# Patient Record
Sex: Female | Born: 1960 | Race: White | Hispanic: No | Marital: Married | Smoking: Current every day smoker
Health system: Southern US, Community
[De-identification: ages and names within clinical notes are randomized; demographics above are authoritative.]

## PROBLEM LIST (undated history)

## (undated) DIAGNOSIS — Z720 Tobacco use: Secondary | ICD-10-CM

## (undated) DIAGNOSIS — F1010 Alcohol abuse, uncomplicated: Secondary | ICD-10-CM

## (undated) DIAGNOSIS — M720 Palmar fascial fibromatosis [Dupuytren]: Secondary | ICD-10-CM

## (undated) DIAGNOSIS — F418 Other specified anxiety disorders: Secondary | ICD-10-CM

## (undated) DIAGNOSIS — I10 Essential (primary) hypertension: Secondary | ICD-10-CM

---

## 2000-10-04 ENCOUNTER — Encounter: Payer: Self-pay | Admitting: Emergency Medicine

## 2000-10-04 ENCOUNTER — Inpatient Hospital Stay (HOSPITAL_COMMUNITY): Admission: AD | Admit: 2000-10-04 | Discharge: 2000-10-06 | Payer: Self-pay | Admitting: *Deleted

## 2000-10-04 ENCOUNTER — Encounter: Payer: Self-pay | Admitting: *Deleted

## 2006-04-07 ENCOUNTER — Inpatient Hospital Stay: Payer: Self-pay | Admitting: Internal Medicine

## 2006-04-07 ENCOUNTER — Other Ambulatory Visit: Payer: Self-pay

## 2006-04-21 ENCOUNTER — Ambulatory Visit: Payer: Self-pay

## 2006-06-15 ENCOUNTER — Ambulatory Visit: Payer: Self-pay

## 2006-06-26 ENCOUNTER — Ambulatory Visit: Payer: Self-pay

## 2007-06-17 ENCOUNTER — Ambulatory Visit: Payer: Self-pay | Admitting: Psychiatry

## 2007-06-17 ENCOUNTER — Inpatient Hospital Stay (HOSPITAL_COMMUNITY): Admission: AD | Admit: 2007-06-17 | Discharge: 2007-06-17 | Payer: Self-pay | Admitting: Psychiatry

## 2007-06-17 ENCOUNTER — Emergency Department (HOSPITAL_COMMUNITY): Admission: EM | Admit: 2007-06-17 | Discharge: 2007-06-17 | Payer: Self-pay | Admitting: Emergency Medicine

## 2007-09-19 ENCOUNTER — Other Ambulatory Visit: Admission: RE | Admit: 2007-09-19 | Discharge: 2007-09-19 | Payer: Self-pay | Admitting: Family Medicine

## 2008-10-13 ENCOUNTER — Emergency Department: Payer: Self-pay | Admitting: Emergency Medicine

## 2009-06-29 ENCOUNTER — Other Ambulatory Visit: Admission: RE | Admit: 2009-06-29 | Discharge: 2009-06-29 | Payer: Self-pay | Admitting: Family Medicine

## 2009-07-01 ENCOUNTER — Encounter: Admission: RE | Admit: 2009-07-01 | Discharge: 2009-07-01 | Payer: Self-pay | Admitting: Family Medicine

## 2009-08-18 ENCOUNTER — Encounter (INDEPENDENT_AMBULATORY_CARE_PROVIDER_SITE_OTHER): Payer: Self-pay | Admitting: Obstetrics and Gynecology

## 2009-08-18 ENCOUNTER — Inpatient Hospital Stay (HOSPITAL_COMMUNITY): Admission: RE | Admit: 2009-08-18 | Discharge: 2009-08-19 | Payer: Self-pay | Admitting: Obstetrics and Gynecology

## 2009-09-22 ENCOUNTER — Observation Stay (HOSPITAL_COMMUNITY): Admission: EM | Admit: 2009-09-22 | Discharge: 2009-09-23 | Payer: Self-pay | Admitting: Emergency Medicine

## 2011-03-17 LAB — POCT I-STAT, CHEM 8
BUN: 6 mg/dL (ref 6–23)
Calcium, Ion: 1.14 mmol/L (ref 1.12–1.32)
Chloride: 106 mEq/L (ref 96–112)

## 2011-03-17 LAB — D-DIMER, QUANTITATIVE: D-Dimer, Quant: 1.01 ug/mL-FEU — ABNORMAL HIGH (ref 0.00–0.48)

## 2011-03-17 LAB — DIFFERENTIAL
Basophils Absolute: 0.1 10*3/uL (ref 0.0–0.1)
Basophils Relative: 1 % (ref 0–1)
Eosinophils Absolute: 0.4 10*3/uL (ref 0.0–0.7)
Eosinophils Relative: 4 % (ref 0–5)
Lymphocytes Relative: 24 % (ref 12–46)
Monocytes Absolute: 0.9 10*3/uL (ref 0.1–1.0)

## 2011-03-17 LAB — CBC
HCT: 38.2 % (ref 36.0–46.0)
Hemoglobin: 13.1 g/dL (ref 12.0–15.0)
Hemoglobin: 13.1 g/dL (ref 12.0–15.0)
MCHC: 34.4 g/dL (ref 30.0–36.0)
MCV: 90.8 fL (ref 78.0–100.0)
Platelets: 376 10*3/uL (ref 150–400)
RDW: 13.2 % (ref 11.5–15.5)
RDW: 13.3 % (ref 11.5–15.5)

## 2011-03-17 LAB — CK TOTAL AND CKMB (NOT AT ARMC): Relative Index: INVALID (ref 0.0–2.5)

## 2011-03-17 LAB — TROPONIN I: Troponin I: 0.03 ng/mL (ref 0.00–0.06)

## 2011-03-17 LAB — CARDIAC PANEL(CRET KIN+CKTOT+MB+TROPI)
CK, MB: 1.2 ng/mL (ref 0.3–4.0)
CK, MB: 1.3 ng/mL (ref 0.3–4.0)
Relative Index: INVALID (ref 0.0–2.5)
Total CK: 46 U/L (ref 7–177)
Troponin I: 0.01 ng/mL (ref 0.00–0.06)

## 2011-03-17 LAB — POCT CARDIAC MARKERS: Troponin i, poc: <0.05 ng/mL (ref 0.00–0.09)

## 2011-03-17 LAB — LIPID PANEL
LDL Cholesterol: 176 mg/dL — ABNORMAL HIGH (ref 0–99)
Triglycerides: 86 mg/dL (ref <150)

## 2011-03-18 LAB — CBC
Hemoglobin: 12.1 g/dL (ref 12.0–15.0)
MCHC: 34.1 g/dL (ref 30.0–36.0)
MCV: 94.3 fL (ref 78.0–100.0)
Platelets: 346 10*3/uL (ref 150–400)
RDW: 13.2 % (ref 11.5–15.5)
RDW: 13.4 % (ref 11.5–15.5)

## 2011-03-18 LAB — BASIC METABOLIC PANEL
GFR calc non Af Amer: >60 mL/min (ref >60)
Potassium: 3.3 mEq/L — ABNORMAL LOW (ref 3.5–5.1)
Sodium: 134 mEq/L — ABNORMAL LOW (ref 135–145)

## 2011-03-18 LAB — TYPE AND SCREEN: Antibody Screen: NEGATIVE

## 2011-03-18 LAB — HCG, SERUM, QUALITATIVE: Preg, Serum: NEGATIVE

## 2011-04-26 NOTE — H&P (Signed)
[name], [name]                ACCOUNT NO.:  [id number]   MEDICAL RECORD NO.:  [id number]          PATIENT TYPE:  IPS   LOCATION:  0506                          FACILITY:  [facility]   PHYSICIAN:  Geoffery Lyons, M.D.      DATE OF BIRTH:  [birth year]   DATE OF ADMISSION:  06/17/2007  DATE OF DISCHARGE:                       PSYCHIATRIC ADMISSION ASSESSMENT   IDENTIFYING INFORMATION:  This is an involuntary admission to the  services of Dr. Geoffery Lyons.  This is a 50 year old married white  female.  Apparently, she called the sheriff yesterday and reported  suicidal ideation.  She was extremely tearful.  She stated she would  just be better off dead.  She stated she just wants to go to sleep and  never wake up.  She drank a lot the night before and called the police  stating that she wanted some mental health.  When the sheriff came to  get her, she was taken to mental health.  She blew over the normal  alcohol limit allowed and so she must be medically cleared and hence was  taken to the ED at [facility].  She was obviously intoxicated.  She stated she just wanted a gun to blow her head off and that she is  not going to stay in this place.  She wants to go home and forget that  all this happened.  Recent stressors include children back in the home  that are of legal age and her father who is dying in the hospital and is  actually under hospice care.  Alcohol level in the ED was 209.  Her UDS  was negative for any other substances.  Her urinalysis was positive for  leukocyte esterase so she does have a UTI as well.   PAST PSYCHIATRIC HISTORY:  The patient states that her drinking has  become problematic in the last two years.  She did go the [facility]  in September of 2007 and tried to stop drinking on her own but however  has been unsuccessful.   SOCIAL HISTORY:  She graduated high school in 1982.  She has been  married once for 25 years.  She has two children, a son 38 and  a  daughter who is 20 today.  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   FAMILY HISTORY:  She states that her whole family on both sides have  substance abuse issues.   ALCOHOL/DRUG HISTORY:  She has been drinking alcohol since age 50, 8-12  beers six times per week for the past two years.   PRIMARY CARE PHYSICIAN:  She does not have a medical doctor.   MEDICAL PROBLEMS:  She has no known medical problems although today she  does have a urinary tract infection.   ALLERGIES:  No known drug allergies.   POSITIVE PHYSICAL FINDINGS:  She was medically cleared in the ED.  She  was noted to have an elevated white blood cell count at 13.3.  Leukocyte  esterase was positive and her alcohol level was 209.  Her  vital signs on  admission to our unit show that she is 5 feet 3 inches, weighs 136.5  pounds, temperature is 97.1, blood pressure 138/94, pulse 93-101,  respirations 20.  She is status post C-sections in 1986 and 1985.  She  does have hand and joint pain and her job is very physical as the  produce at the [employer].   MENTAL STATUS EXAM:  She is alert and oriented.  She is casually groomed  and dressed in scrubs.  She appears to be adequately nourished.  Her  speech is not pressured.  Her mood is depressed and anxious as is her  affect.  However, it is appropriate to the situation.  Thought processes  are clear, rational and goal-oriented.  She is requesting discharge as  she is fearful that she will lose her employment.  Judgment and insight  seemed to be intact.  Concentration and memory are intact.  Intelligence  is at least average.  She denies being suicidal or homicidal.  She  denies any auditory or visual hallucinations.  She denies tremor.   DIAGNOSES:  AXIS I:  Alcohol abuse; rule out dependence.  Major  depressive disorder.  AXIS II:  Deferred.  AXIS III:  Urinary tract infection, pain in joints.  AXIS IV:  Conflict with husband and  children.  AXIS V:  30.   PLAN:  To admit for detox.  To start medications, specifically Cymbalta.  We will treat her UTI with Cipro.  She was encouraged to approach  hospice regarding grief support and a family session with her husband  was set up this afternoon as if all parties are in agreement, we may be  able to discharge her and ease her mind about potential loss of her  employment.      Mickie Leonarda Salon, P.A.-C.      Geoffery Lyons, M.D.  Electronically Signed    MD/MEDQ  D:  06/17/2007  T:  06/17/2007  Job:  [id number]

## 2011-04-29 NOTE — Discharge Summary (Signed)
[facility]  Patient:    [name], [name]                       MRN: [id number] Adm. Date:  [id number] Disc. Date: [id number] Attending:  Ardeen Fillers                           Discharge Summary  DISCHARGE DIAGNOSES:          1. Viral gastroenteritis.                               2. Dehydration.                               3. Hypokalemia.                               4. Left ovarian cyst.                               5. Status post tubal ligation.  HISTORY OF PRESENT ILLNESS:   A 50 year old woman, G2, P2, status post tubal ligation in the 1980s presented to the [facility] Emergency Room early on the morning of October 04, 2000, with complaints of two days of lower abdominal pain and relentless nausea and vomiting.  A GYN consultation was called and I evaluated her for CT scan with findings of left ovarian cyst and rule out GYN etiology of pain.  The pain was initially in left lower quadrant but spread over the lower abdomen over the following two days.  She vomited and had diarrhea continually.  CT scan of the abdomen revealed a negative upper abdomen and a left ovarian cyst.  White blood cell count on admission was 19,000.  Her abdomen was distended and there was guarding but there were no peritoneal signs.  Her potassium was 3.2.  Urinalysis was negative.  Although symptoms were most consistent with a viral gastroenteritis, because of the CT findings, she was taken on the [facility] OB[facility]GYN service and transferred to [facility] for observation.  HOSPITAL COURSE:              Over the next two days she defervesced spontaneously.  Her hypokalemia was repleted with IV potassium.  On the second hospital day, the pain had localized in the right lower quadrant.  Surgical consultation was obtained and she was seen by Adolph Pollack, M.D., who did not feel that she had appendicitis.  She had no further nausea or vomiting or diarrhea  after admission to [facility].  The patients pain remitted and was most consistent with gas discomfort at the time of discharge on the third hospital day.  She will be followed up by [facility] Amabile. Roslyn Smiling, M.D., as a new GYN patient in the next few weeks.  Consideration of repeat ultrasound will be performed, although the left cyst is most consistent with a physiologic ovarian cyst.  DISPOSITION:                  She was discharged to home in stable condition on October 06, 2000.  FOLLOW-UP:  She will follow up with her primary doctor if her GI symptoms recur.  MEDICATIONS AT DISCHARGE:     Tylenol p.r.n.  Glycerin p.r.n.  Multivitamins for mild anemia.  Hemoglobin on the second hospital day was 11.1. DD:  10/06/00 TD:  10/06/00 Job: [id number]

## 2011-04-29 NOTE — Discharge Summary (Signed)
[name], [name]                ACCOUNT NO.:  [id number]   MEDICAL RECORD NO.:  [id number]          PATIENT TYPE:  IPS   LOCATION:  0506                          FACILITY:  [facility]   PHYSICIAN:  Geoffery Lyons, M.D.      DATE OF BIRTH:  January 18, 1961   DATE OF ADMISSION:  06/17/2007  DATE OF DISCHARGE:  06/17/2007                               DISCHARGE SUMMARY   CHIEF COMPLAINT AND HISTORY OF PRESENT ILLNESS:  This was the first  admission to [facility] for this 50 year old married,  white female.  She called the [place] _________[place]  police with suicidal  thoughts, being very tearful.  Endorsed then that she would rather be  dead.  Endorsed that she just wanted to go to sleep and never wake up.  She drank a lot that night.  She called the police stating that she  wanted some help.  She was taken to mental health.   Upon this evaluation, she endorsed that she was intoxicated.  She felt  she had to go home.  She had children at home that were of little age  and her father who died in the hospital was under Hospice care.   PAST PSYCHIATRIC HISTORY:  Had been to [facility] in September 2007,  tried to stop drinking, was not successful.   ALCOHOL AND DRUG HISTORY:  Endorsed that alcohol had become a problem in  the last 2 years.  Denied any other substances.   MEDICAL HISTORY:  Urinary tract infection.   PHYSICAL EXAMINATION:  Performed and failed to show any acute findings.   LABORATORY DATA:  laboratory workup not available in the chart.   MENTAL STATUS EXAM:  Reveals an alert, cooperative female casually  groomed and dressed.  Speech was not pressured, normal rate, tempo and  production.  Mood depressed, anxious.  Thought processes are clear,  rational and goal-oriented.  Requesting discharge.  Afraid she was going  to lose her employment.  Concerned about her kids and her father.  There  are no active suicidal or homicidal ideas, no hallucinations.  Cognition  was  well-preserved.   ADMISSION DIAGNOSIS:  AXIS I: Alcohol dependence, depressive disorder  not otherwise specified.  AXIS II: No diagnosis.  AXIS III: Urinary tract infection.  AXIS IV: Moderate.  AXIS V:  GAF on admission 30, GAF in the last year 60.   COURSE IN THE HOSPITAL:  She was admitted.  We attempted to detox, but  she was wanting to go home.  We contacted the family.  The husband came.  They talked about what was going on.  She was willing to go to [facility].  The husband was supportive.  She endorsed she was not suicidal,  so we went ahead and discharged her to outpatient followup.   DISCHARGE DIAGNOSIS:  AXIS I: Alcohol dependence, depressive disorder  not otherwise specified.  AXIS II: No diagnosis.  AXIS III: Urinary tract infection.  AXIS IV: Moderate.  AXIS V:  GAF on discharge 50.   DISCHARGE MEDICATIONS:  1. Librium taper 25 mg one  at bedtime on July 6; then July 7 Librium      25 take one 3 times a day; July 8  Librium 25 one twice a day; July      9 Librium 25 one in the morning, then discontinue.  2. Cymbalta 60 mg per day.   FOLLOW UP:  Follow up at [facility].      Geoffery Lyons, M.D.  Electronically Signed     IL/MEDQ  D:  07/16/2007  T:  07/17/2007  Job:  [id number]

## 2011-05-18 IMAGING — CT CT ANGIO CHEST
2 of 6 series · 19 of 36 positions shown · IV contrast (agent unspecified)
Comparison: none

CLINICAL DATA: Severe chest pain on the left side.

CT ANGIOGRAPHY CHEST WITH CONTRAST
TECHNIQUE: Multidetector CT imaging of the chest was performed
using the standard protocol during bolus administration of
intravenous contrast. Multiplanar CT image reconstructions
including MIPs were obtained to evaluate the vascular anatomy.
Contrast: 100 mlOmnipaque 300

[Series 9: pulm embolism 1.0 b25f thins · axial · 0.65mm/px · z∈[-230,-18]mm · 18 of 236 slices shown]
[im 12/236  lung]
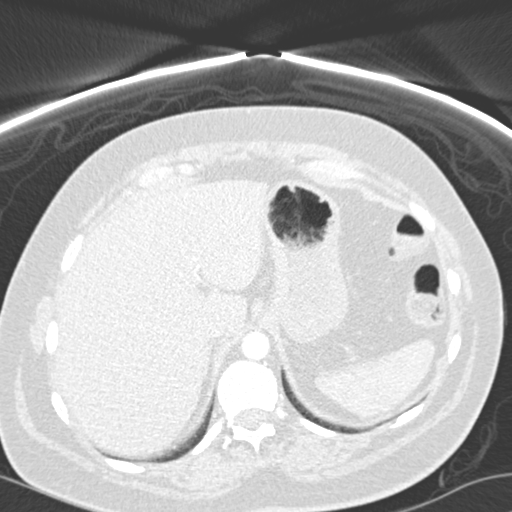
[im 24/236  mediastinal]
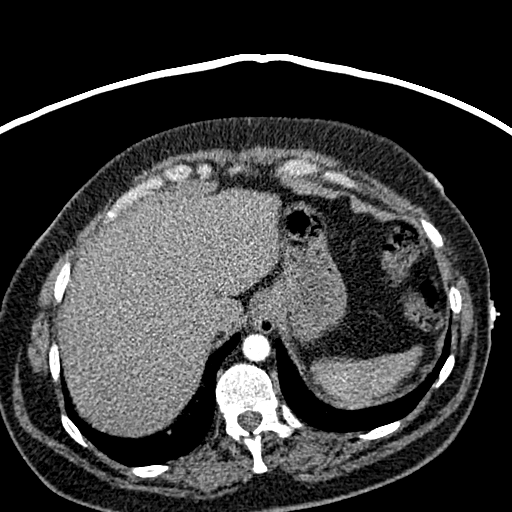
[im 36/236  lung]
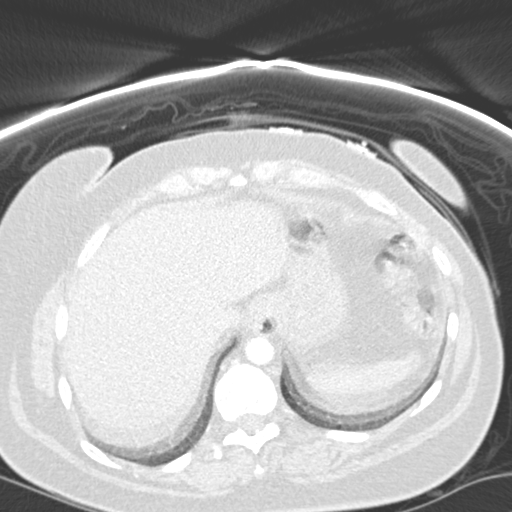
[im 48/236  mediastinal]
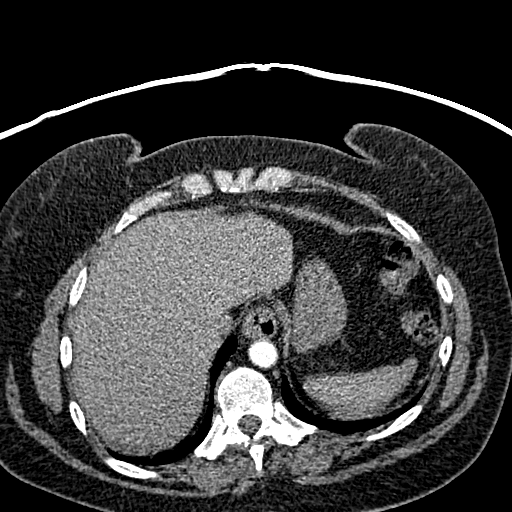
[im 59/236  lung]
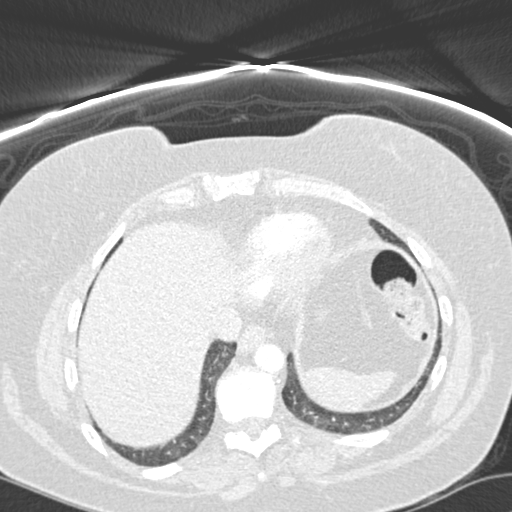
[im 71/236  mediastinal]
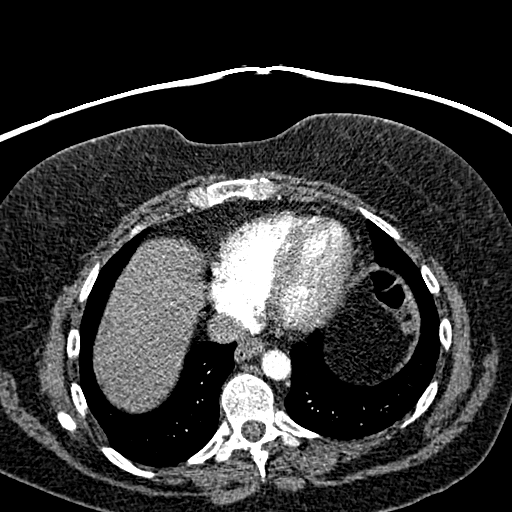
[im 83/236  lung]
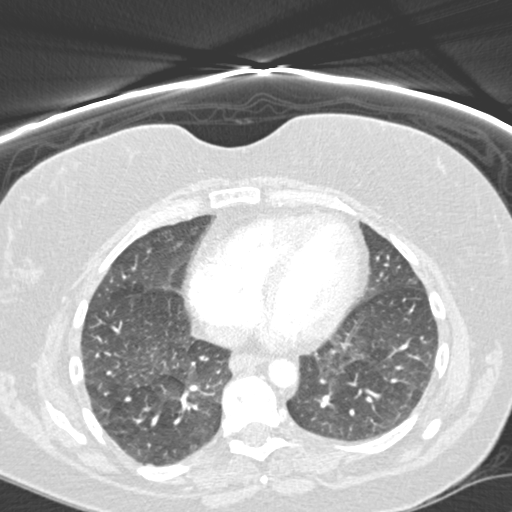
[im 95/236  mediastinal]
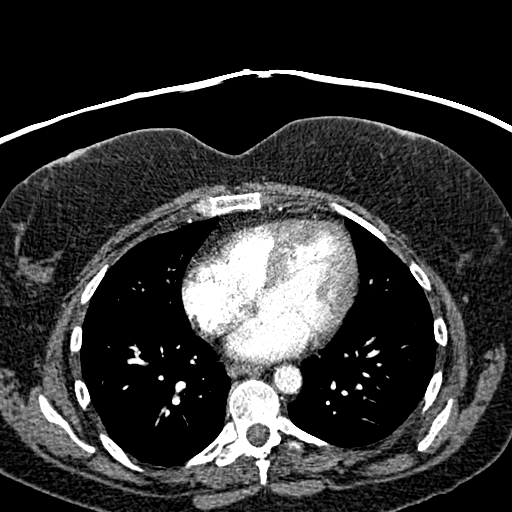
[im 106/236  lung]
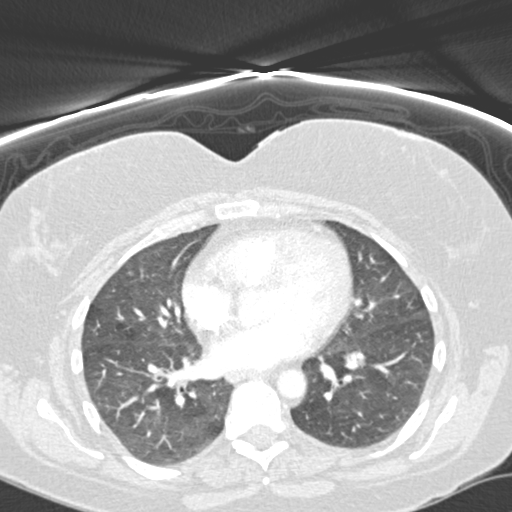
[im 130/236  mediastinal]
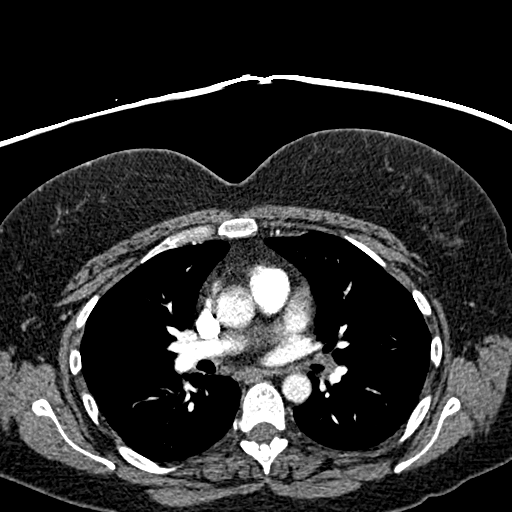
[im 142/236  lung]
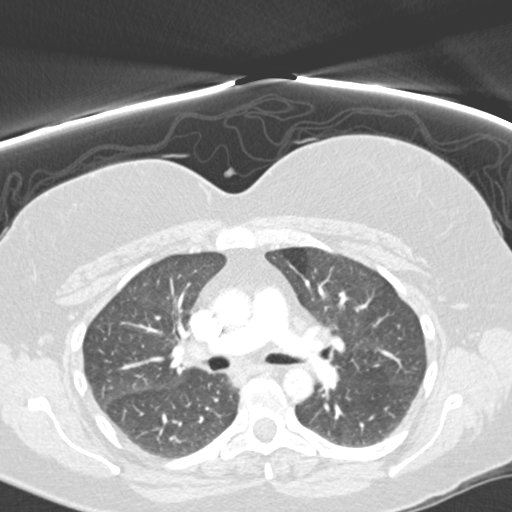
[im 153/236  mediastinal]
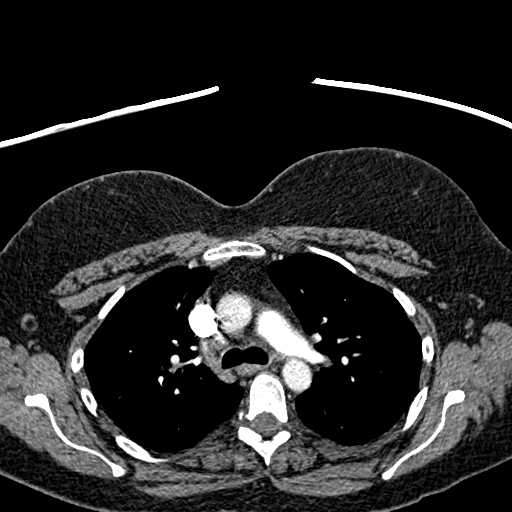
[im 165/236  lung]
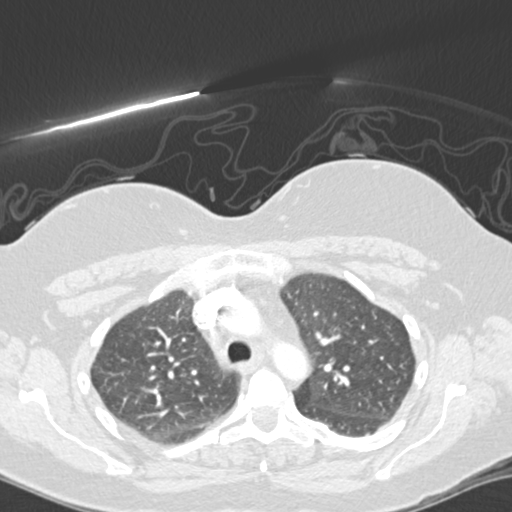
[im 177/236  mediastinal]
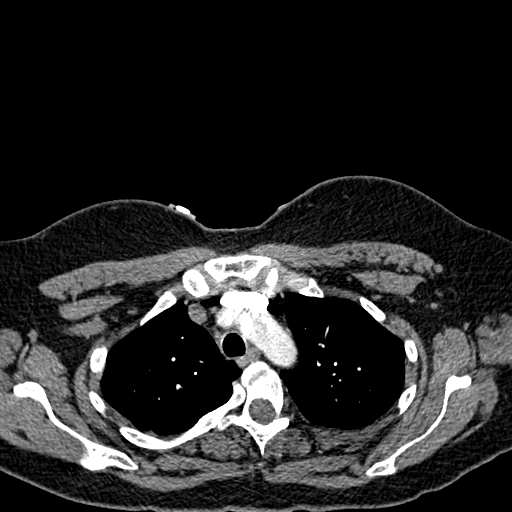
[im 189/236  lung]
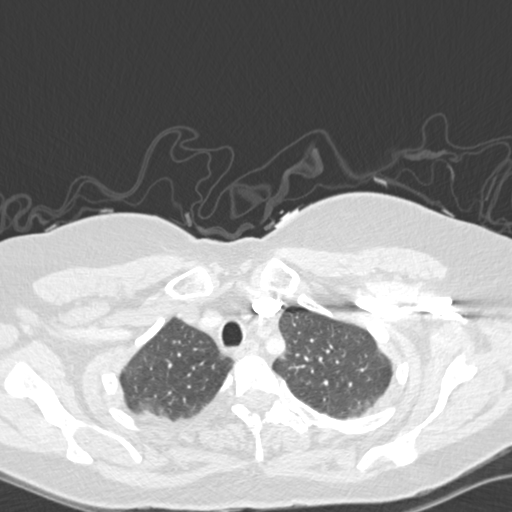
[im 200/236  mediastinal]
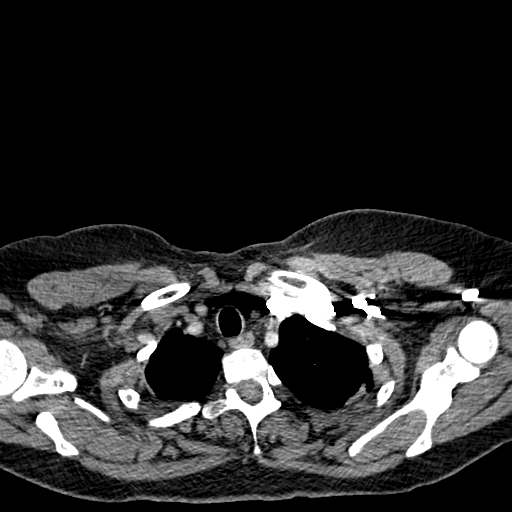
[im 212/236  lung]
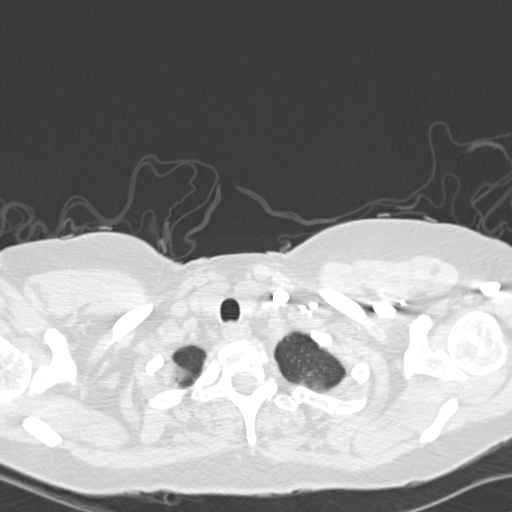
[im 224/236  mediastinal]
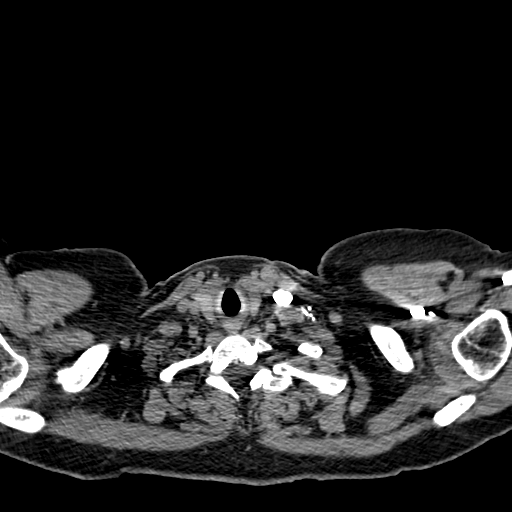

[Series 602: cor mpr · coronal · 0.65mm/px · 1 of 85 slices shown]
[im 43/85  mediastinal]
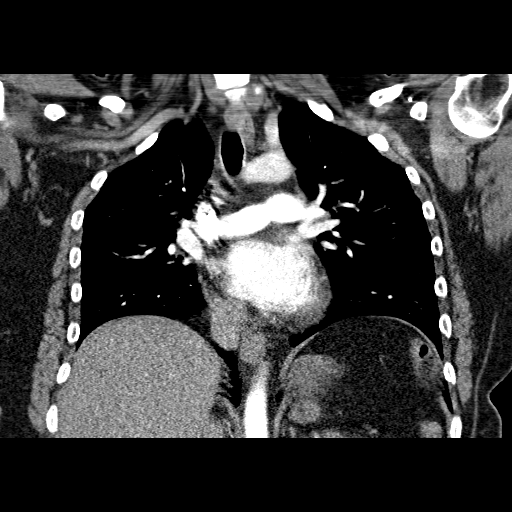

[19 of 36 positions shown; findings below may reference images not displayed]

FINDINGS: Study is technically adequate.  There are no filling
defects within the pulmonary arteries to suggest acute pulmonary
embolism.  No acute findings of the aorta or great vessels.  No
pericardial effusion.

No chest wall abnormality.  No evidence of axillary or
supraclavicular lymphadenopathy.  No mediastinal or hilar
lymphadenopathy.

Lung windows demonstrates no evidence of effusion, pneumonia, or
pneumothorax.

Limited view of the upper abdomen is unremarkable.

Limited view of the skeleton is unremarkable.

Review of the MIP images confirms the above findings.
IMPRESSION: No evidence acute pulmonary embolism.

## 2011-09-27 LAB — URINALYSIS, ROUTINE W REFLEX MICROSCOPIC
Ketones, ur: NEGATIVE
Protein, ur: NEGATIVE
Urobilinogen, UA: 0.2

## 2011-09-27 LAB — URINE MICROSCOPIC-ADD ON

## 2011-09-27 LAB — CBC
HCT: 44.7
Hemoglobin: 15.3 — ABNORMAL HIGH
MCHC: 34.3
MCV: 91.6
RBC: 4.88
RDW: 13.4

## 2011-09-27 LAB — I-STAT 8, (EC8 V) (CONVERTED LAB)
Glucose, Bld: 102 — ABNORMAL HIGH
Potassium: 3.7
TCO2: 23
pH, Ven: 7.357 — ABNORMAL HIGH

## 2011-09-27 LAB — RAPID URINE DRUG SCREEN, HOSP PERFORMED
Barbiturates: NOT DETECTED
Cocaine: NOT DETECTED
Opiates: NOT DETECTED
Tetrahydrocannabinol: NOT DETECTED

## 2011-09-27 LAB — POCT I-STAT CREATININE: Operator id: 133351

## 2011-09-27 LAB — DIFFERENTIAL
Basophils Absolute: 0.2 — ABNORMAL HIGH
Basophils Relative: 2 — ABNORMAL HIGH
Eosinophils Absolute: 0.5
Eosinophils Relative: 3
Monocytes Absolute: 0.9 — ABNORMAL HIGH
Monocytes Relative: 7

## 2020-02-13 ENCOUNTER — Other Ambulatory Visit: Payer: Self-pay

## 2020-02-14 ENCOUNTER — Ambulatory Visit (INDEPENDENT_AMBULATORY_CARE_PROVIDER_SITE_OTHER): Payer: Self-pay | Admitting: Family Medicine

## 2020-02-14 ENCOUNTER — Encounter: Payer: Self-pay | Admitting: Family Medicine

## 2020-02-14 VITALS — BP 168/108 | HR 97 | Temp 97.7°F | Ht 63.0 in | Wt 139.2 lb

## 2020-02-14 DIAGNOSIS — Z7289 Other problems related to lifestyle: Secondary | ICD-10-CM

## 2020-02-14 DIAGNOSIS — L639 Alopecia areata, unspecified: Secondary | ICD-10-CM | POA: Insufficient documentation

## 2020-02-14 DIAGNOSIS — R030 Elevated blood-pressure reading, without diagnosis of hypertension: Secondary | ICD-10-CM | POA: Insufficient documentation

## 2020-02-14 DIAGNOSIS — Z789 Other specified health status: Secondary | ICD-10-CM

## 2020-02-14 DIAGNOSIS — L659 Nonscarring hair loss, unspecified: Secondary | ICD-10-CM | POA: Insufficient documentation

## 2020-02-14 DIAGNOSIS — Z23 Encounter for immunization: Secondary | ICD-10-CM

## 2020-02-14 DIAGNOSIS — F418 Other specified anxiety disorders: Secondary | ICD-10-CM

## 2020-02-14 DIAGNOSIS — F1090 Alcohol use, unspecified, uncomplicated: Secondary | ICD-10-CM

## 2020-02-14 DIAGNOSIS — Z0000 Encounter for general adult medical examination without abnormal findings: Secondary | ICD-10-CM

## 2020-02-14 LAB — URINALYSIS, ROUTINE W REFLEX MICROSCOPIC
Bilirubin Urine: NEGATIVE
Hgb urine dipstick: NEGATIVE
Ketones, ur: NEGATIVE
Leukocytes,Ua: NEGATIVE
Nitrite: NEGATIVE
Specific Gravity, Urine: >=1.03 — AB (ref 1.000–1.030)
Total Protein, Urine: NEGATIVE
Urine Glucose: NEGATIVE
Urobilinogen, UA: 0.2 (ref 0.0–1.0)
pH: 5 (ref 5.0–8.0)

## 2020-02-14 LAB — COMPREHENSIVE METABOLIC PANEL
ALT: 28 U/L (ref 0–35)
AST: 26 U/L (ref 0–37)
Albumin: 4.4 g/dL (ref 3.5–5.2)
Alkaline Phosphatase: 113 U/L (ref 39–117)
BUN: 8 mg/dL (ref 6–23)
CO2: 31 mEq/L (ref 19–32)
Calcium: 10.4 mg/dL (ref 8.4–10.5)
Chloride: 103 mEq/L (ref 96–112)
Creatinine, Ser: 0.69 mg/dL (ref 0.40–1.20)
GFR: 87.87 mL/min (ref >60.00)
Glucose, Bld: 89 mg/dL (ref 70–99)
Potassium: 4.7 mEq/L (ref 3.5–5.1)
Sodium: 142 mEq/L (ref 135–145)
Total Bilirubin: 0.4 mg/dL (ref 0.2–1.2)
Total Protein: 7.8 g/dL (ref 6.0–8.3)

## 2020-02-14 LAB — CBC
HCT: 46 % (ref 36.0–46.0)
Hemoglobin: 15.8 g/dL — ABNORMAL HIGH (ref 12.0–15.0)
MCHC: 34.4 g/dL (ref 30.0–36.0)
MCV: 96.7 fl (ref 78.0–100.0)
Platelets: 301 10*3/uL (ref 150.0–400.0)
RBC: 4.76 Mil/uL (ref 3.87–5.11)
RDW: 12.9 % (ref 11.5–15.5)
WBC: 7.8 10*3/uL (ref 4.0–10.5)

## 2020-02-14 LAB — LIPID PANEL
Cholesterol: 228 mg/dL — ABNORMAL HIGH (ref 0–200)
HDL: 61 mg/dL (ref >39.00)
LDL Cholesterol: 142 mg/dL — ABNORMAL HIGH (ref 0–99)
NonHDL: 166.56
Total CHOL/HDL Ratio: 4
Triglycerides: 123 mg/dL (ref 0.0–149.0)
VLDL: 24.6 mg/dL (ref 0.0–40.0)

## 2020-02-14 LAB — GAMMA GT: GGT: 58 U/L — ABNORMAL HIGH (ref 7–51)

## 2020-02-14 LAB — TSH: TSH: 2.43 u[IU]/mL (ref 0.35–4.50)

## 2020-02-14 MED ORDER — PAROXETINE HCL 10 MG PO TABS: ORAL_TABLET | ORAL | 1 refills | Status: AC

## 2020-02-14 NOTE — Patient Instructions (Signed)
Alopecia Areata, Adult  Alopecia areata is a condition that causes you to lose hair. You may lose hair on your scalp in patches. In some cases, you may lose all the hair on your scalp (alopecia totalis) or all the hair from your face and body (alopecia universalis). Alopecia areata is an autoimmune disease. This means that your body's defense system (immune system) mistakes normal parts of the body for germs or other things that can make you sick. When you have alopecia areata, the immune system attacks the hair follicles. Alopecia areata usually develops in childhood, but it can develop at any age. For some people, their hair grows back on its own and hair loss does not happen again. For others, their hair may fall out and grow back in cycles. The hair loss may last many years. Having this condition can be emotionally difficult, but it is not dangerous. What are the causes? The cause of this condition is not known. What increases the risk? This condition is more likely to develop in people who have:  A family history of alopecia.  A family history of another autoimmune disease, including type 1 diabetes and rheumatoid arthritis.  Asthma and allergies.  Down syndrome. What are the signs or symptoms? Round spots of patchy hair loss on the scalp is the main symptom of this condition. The spots may be mildly itchy. Other symptoms include:  Short dark hairs in the bald patches that are wider at the top (exclamation point hairs).  Dents, white spots, or lines in the fingernails or toenails.  Balding and body hair loss. This is rare. How is this diagnosed? This condition is diagnosed based on your symptoms and family history. Your health care provider will also check your scalp skin, teeth, and nails. Your health care provider may refer you to a specialist in hair and skin disorders (dermatologist). You may also have tests, including:  A hair pull test.  Blood tests or other screening tests  to check for autoimmune diseases, such as thyroid disease or diabetes.  Skin biopsy to confirm the diagnosis.  A procedure to examine the skin with a lighted magnifying instrument (dermoscopy). How is this treated? There is no cure for alopecia areata. Treatment is aimed at promoting the regrowth of hair and preventing the immune system from overreacting. No single treatment is right for all people with alopecia areata. It depends on the type of hair loss you have and how severe it is. Work with your health care provider to find the best treatment for you. Treatment may include:  Having regular checkups to make sure the condition is not getting worse (watchful waiting).  Steroid creams or pills for 6-8 weeks to stop the immune reaction and help hair to regrow more quickly.  Other topical medicines to alter the immune system response and support the hair growth cycle.  Steroid injections.  Therapy and counseling with a support group or therapist if you are having trouble coping with hair loss. Follow these instructions at home:  Learn as much as you can about your condition.  Apply topical creams only as told by your health care provider.  Take over-the-counter and prescription medicines only as told by your health care provider.  Consider getting a wig or products to make hair look fuller or to cover bald spots, if you feel uncomfortable with your appearance.  Get therapy or counseling if you are having a hard time coping with hair loss. Ask your health care provider to recommend  a counselor or support group.  Keep all follow-up visits as told by your health care provider. This is important. Contact a health care provider if:  Your hair loss gets worse, even with treatment.  You have new symptoms.  You are struggling emotionally. Summary  Alopecia areata is an autoimmune condition that makes your body's defense system (immune system) attack the hair follicles. This causes you  to lose hair.  Treatments may include regular checkups to make sure that the condition is not getting worse (watchful waiting), medicines, and steroid injections. This information is not intended to replace advice given to you by your health care provider. Make sure you discuss any questions you have with your health care provider. Document Revised: 11/10/2017 Document Reviewed: 12/16/2016 Elsevier Patient Education  2020 [place] Maintenance, Female Adopting a healthy lifestyle and getting preventive care are important in promoting health and wellness. Ask your health care provider about:  The right schedule for you to have regular tests and exams.  Things you can do on your own to prevent diseases and keep yourself healthy. What should I know about diet, weight, and exercise? Eat a healthy diet   Eat a diet that includes plenty of vegetables, fruits, low-fat dairy products, and lean protein.  Do not eat a lot of foods that are high in solid fats, added sugars, or sodium. Maintain a healthy weight Body mass index (BMI) is used to identify weight problems. It estimates body fat based on height and weight. Your health care provider can help determine your BMI and help you achieve or maintain a healthy weight. Get regular exercise Get regular exercise. This is one of the most important things you can do for your health. Most adults should:  Exercise for at least 150 minutes each week. The exercise should increase your heart rate and make you sweat (moderate-intensity exercise).  Do strengthening exercises at least twice a week. This is in addition to the moderate-intensity exercise.  Spend less time sitting. Even light physical activity can be beneficial. Watch cholesterol and blood lipids Have your blood tested for lipids and cholesterol at 57 years of age, then have this test every 5 years. Have your cholesterol levels checked more often if:  Your lipid or cholesterol  levels are high.  You are older than 57 years of age.  You are at high risk for heart disease. What should I know about cancer screening? Depending on your health history and family history, you may need to have cancer screening at various ages. This may include screening for:  Breast cancer.  Cervical cancer.  Colorectal cancer.  Skin cancer.  Lung cancer. What should I know about heart disease, diabetes, and high blood pressure? Blood pressure and heart disease  High blood pressure causes heart disease and increases the risk of stroke. This is more likely to develop in people who have high blood pressure readings, are of African descent, or are overweight.  Have your blood pressure checked: ? Every 3-5 years if you are 81-24 years of age. ? Every year if you are 59 years old or older. Diabetes Have regular diabetes screenings. This checks your fasting blood sugar level. Have the screening done:  Once every three years after age 84 if you are at a normal weight and have a low risk for diabetes.  More often and at a younger age if you are overweight or have a high risk for diabetes. What should I know about preventing infection? Hepatitis  B If you have a higher risk for hepatitis B, you should be screened for this virus. Talk with your health care provider to find out if you are at risk for hepatitis B infection. Hepatitis C Testing is recommended for:  Everyone born from [birth year] through 1963.  Anyone with known risk factors for hepatitis C. Sexually transmitted infections (STIs)  Get screened for STIs, including gonorrhea and chlamydia, if: ? You are sexually active and are younger than 57 years of age. ? You are older than 57 years of age and your health care provider tells you that you are at risk for this type of infection. ? Your sexual activity has changed since you were last screened, and you are at increased risk for chlamydia or gonorrhea. Ask your health care  provider if you are at risk.  Ask your health care provider about whether you are at high risk for HIV. Your health care provider may recommend a prescription medicine to help prevent HIV infection. If you choose to take medicine to prevent HIV, you should first get tested for HIV. You should then be tested every 3 months for as long as you are taking the medicine. Pregnancy  If you are about to stop having your period (premenopausal) and you may become pregnant, seek counseling before you get pregnant.  Take 400 to 800 micrograms (mcg) of folic acid every day if you become pregnant.  Ask for birth control (contraception) if you want to prevent pregnancy. Osteoporosis and menopause Osteoporosis is a disease in which the bones lose minerals and strength with aging. This can result in bone fractures. If you are 71 years old or older, or if you are at risk for osteoporosis and fractures, ask your health care provider if you should:  Be screened for bone loss.  Take a calcium or vitamin D supplement to lower your risk of fractures.  Be given hormone replacement therapy (HRT) to treat symptoms of menopause. Follow these instructions at home: Lifestyle  Do not use any products that contain nicotine or tobacco, such as cigarettes, e-cigarettes, and chewing tobacco. If you need help quitting, ask your health care provider.  Do not use street drugs.  Do not share needles.  Ask your health care provider for help if you need support or information about quitting drugs. Alcohol use  Do not drink alcohol if: ? Your health care provider tells you not to drink. ? You are pregnant, may be pregnant, or are planning to become pregnant.  If you drink alcohol: ? Limit how much you use to 0-1 drink a day. ? Limit intake if you are breastfeeding.  Be aware of how much alcohol is in your drink. In the [place] one drink equals one 12 oz bottle of beer (355 mL), one 5 oz glass of wine (148 mL), or one 1  oz glass of hard liquor (44 mL). General instructions  Schedule regular health, dental, and eye exams.  Stay current with your vaccines.  Tell your health care provider if: ? You often feel depressed. ? You have ever been abused or do not feel safe at home. Summary  Adopting a healthy lifestyle and getting preventive care are important in promoting health and wellness.  Follow your health care provider's instructions about healthy diet, exercising, and getting tested or screened for diseases.  Follow your health care provider's instructions on monitoring your cholesterol and blood pressure. This information is not intended to replace advice given to you by your health  care provider. Make sure you discuss any questions you have with your health care provider. Document Revised: 11/21/2018 Document Reviewed: 11/21/2018 Elsevier Patient Education  [place].  Preventing Hypertension Hypertension, commonly called high blood pressure, is when the force of blood pumping through the arteries is too strong. Arteries are blood vessels that carry blood from the heart throughout the body. Over time, hypertension can damage the arteries and decrease blood flow to important parts of the body, including the brain, heart, and kidneys. Often, hypertension does not cause symptoms until blood pressure is very high. For this reason, it is important to have your blood pressure checked on a regular basis. Hypertension can often be prevented with diet and lifestyle changes. If you already have hypertension, you can control it with diet and lifestyle changes, as well as medicine. What nutrition changes can be made? Maintain a healthy diet. This includes:  Eating less salt (sodium). Ask your health care provider how much sodium is safe for you to have. The general recommendation is to consume less than 1 tsp (2,300 mg) of sodium a day. ? Do not add salt to your food. ? Choose low-sodium options when  grocery shopping and eating out.  Limiting fats in your diet. You can do this by eating low-fat or fat-free dairy products and by eating less red meat.  Eating more fruits, vegetables, and whole grains. Make a goal to eat: ? 1-2 cups of fresh fruits and vegetables each day. ? 3-4 servings of whole grains each day.  Avoiding foods and beverages that have added sugars.  Eating fish that contain healthy fats (omega-3 fatty acids), such as mackerel or salmon. If you need help putting together a healthy eating plan, try the DASH diet. This diet is high in fruits, vegetables, and whole grains. It is low in sodium, red meat, and added sugars. DASH stands for Dietary Approaches to Stop Hypertension. What lifestyle changes can be made?   Lose weight if you are overweight. Losing just 3?5% of your body weight can help prevent or control hypertension. ? For example, if your present weight is 200 lb (91 kg), a loss of 3-5% of your weight means losing 6-10 lb (2.7-4.5 kg). ? Ask your health care provider to help you with a diet and exercise plan to safely lose weight.  Get enough exercise. Do at least 150 minutes of moderate-intensity exercise each week. ? You could do this in short exercise sessions several times a day, or you could do longer exercise sessions a few times a week. For example, you could take a brisk 10-minute walk or bike ride, 3 times a day, for 5 days a week.  Find ways to reduce stress, such as exercising, meditating, listening to music, or taking a yoga class. If you need help reducing stress, ask your health care provider.  Do not smoke. This includes e-cigarettes. Chemicals in tobacco and nicotine products raise your blood pressure each time you smoke. If you need help quitting, ask your health care provider.  Avoid alcohol. If you drink alcohol, limit alcohol intake to no more than 1 drink a day for nonpregnant women and 2 drinks a day for men. One drink equals 12 oz of beer, 5  oz of wine, or 1 oz of hard liquor. Why are these changes important? Diet and lifestyle changes can help you prevent hypertension, and they may make you feel better overall and improve your quality of life. If you have hypertension, making these changes  will help you control it and help prevent major complications, such as:  Hardening and narrowing of arteries that supply blood to: ? Your heart. This can cause a heart attack. ? Your brain. This can cause a stroke. ? Your kidneys. This can cause kidney failure.  Stress on your heart muscle, which can cause heart failure. What can I do to lower my risk?  Work with your health care provider to make a hypertension prevention plan that works for you. Follow your plan and keep all follow-up visits as told by your health care provider.  Learn how to check your blood pressure at home. Make sure that you know your personal target blood pressure, as told by your health care provider. How is this treated? In addition to diet and lifestyle changes, your health care provider may recommend medicines to help lower your blood pressure. You may need to try a few different medicines to find what works best for you. You also may need to take more than one medicine. Take over-the-counter and prescription medicines only as told by your health care provider. Where to find support Your health care provider can help you prevent hypertension and help you keep your blood pressure at a healthy level. Your local hospital or your community may also provide support services and prevention programs. The American Heart Association offers an online support network at: CheapBootlegs.com.cy Where to find more information Learn more about hypertension from:  [place], Lung, and Blood Institute: ElectronicHangman.is  Centers for Disease Control and Prevention: https://ingram.com/  American Academy of  Family Physicians: http://familydoctor.org/familydoctor/en/diseases-conditions/high-blood-pressure.printerview.all.html Learn more about the DASH diet from:  [place], Lung, and [place]: https://www.reyes.com/ Contact a health care provider if:  You think you are having a reaction to medicines you have taken.  You have recurrent headaches or feel dizzy.  You have swelling in your ankles.  You have trouble with your vision. Summary  Hypertension often does not cause any symptoms until blood pressure is very high. It is important to get your blood pressure checked regularly.  Diet and lifestyle changes are the most important steps in preventing hypertension.  By keeping your blood pressure in a healthy range, you can prevent complications like heart attack, heart failure, stroke, and kidney failure.  Work with your health care provider to make a hypertension prevention plan that works for you. This information is not intended to replace advice given to you by your health care provider. Make sure you discuss any questions you have with your health care provider. Document Revised: 03/22/2019 Document Reviewed: 08/08/2016 Elsevier Patient Education  2020 Elsevier Inc.  Preventive Care 63-42 Years Old, Female Preventive care refers to visits with your health care provider and lifestyle choices that can promote health and wellness. This includes:  A yearly physical exam. This may also be called an annual well check.  Regular dental visits and eye exams.  Immunizations.  Screening for certain conditions.  Healthy lifestyle choices, such as eating a healthy diet, getting regular exercise, not using drugs or products that contain nicotine and tobacco, and limiting alcohol use. What can I expect for my preventive care visit? Physical exam Your health care provider will check your:  Height and weight. This may be used to calculate body mass  index (BMI), which tells if you are at a healthy weight.  Heart rate and blood pressure.  Skin for abnormal spots. Counseling Your health care provider may ask you questions about your:  Alcohol, tobacco, and drug use.  Emotional well-being.  Home and relationship well-being.  Sexual activity.  Eating habits.  Work and work Statistician.  Method of birth control.  Menstrual cycle.  Pregnancy history. What immunizations do I need?  Influenza (flu) vaccine  This is recommended every year. Tetanus, diphtheria, and pertussis (Tdap) vaccine  You may need a Td booster every 10 years. Varicella (chickenpox) vaccine  You may need this if you have not been vaccinated. Zoster (shingles) vaccine  You may need this after age 53. Measles, mumps, and rubella (MMR) vaccine  You may need at least one dose of MMR if you were born in 1957 or later. You may also need a second dose. Pneumococcal conjugate (PCV13) vaccine  You may need this if you have certain conditions and were not previously vaccinated. Pneumococcal polysaccharide (PPSV23) vaccine  You may need one or two doses if you smoke cigarettes or if you have certain conditions. Meningococcal conjugate (MenACWY) vaccine  You may need this if you have certain conditions. Hepatitis A vaccine  You may need this if you have certain conditions or if you travel or work in places where you may be exposed to hepatitis A. Hepatitis B vaccine  You may need this if you have certain conditions or if you travel or work in places where you may be exposed to hepatitis B. Haemophilus influenzae type b (Hib) vaccine  You may need this if you have certain conditions. Human papillomavirus (HPV) vaccine  If recommended by your health care provider, you may need three doses over 6 months. You may receive vaccines as individual doses or as more than one vaccine together in one shot (combination vaccines). Talk with your health care  provider about the risks and benefits of combination vaccines. What tests do I need? Blood tests  Lipid and cholesterol levels. These may be checked every 5 years, or more frequently if you are over 48 years old.  Hepatitis C test.  Hepatitis B test. Screening  Lung cancer screening. You may have this screening every year starting at age 55 if you have a 30-pack-year history of smoking and currently smoke or have quit within the past 15 years.  Colorectal cancer screening. All adults should have this screening starting at age 69 and continuing until age 50. Your health care provider may recommend screening at age 28 if you are at increased risk. You will have tests every 1-10 years, depending on your results and the type of screening test.  Diabetes screening. This is done by checking your blood sugar (glucose) after you have not eaten for a while (fasting). You may have this done every 1-3 years.  Mammogram. This may be done every 1-2 years. Talk with your health care provider about when you should start having regular mammograms. This may depend on whether you have a family history of breast cancer.  BRCA-related cancer screening. This may be done if you have a family history of breast, ovarian, tubal, or peritoneal cancers.  Pelvic exam and Pap test. This may be done every 3 years starting at age 68. Starting at age 18, this may be done every 5 years if you have a Pap test in combination with an HPV test. Other tests  Sexually transmitted disease (STD) testing.  Bone density scan. This is done to screen for osteoporosis. You may have this scan if you are at high risk for osteoporosis. Follow these instructions at home: Eating and drinking  Eat a diet that includes fresh fruits and vegetables, whole  grains, lean protein, and low-fat dairy.  Take vitamin and mineral supplements as recommended by your health care provider.  Do not drink alcohol if: ? Your health care provider tells  you not to drink. ? You are pregnant, may be pregnant, or are planning to become pregnant.  If you drink alcohol: ? Limit how much you have to 0-1 drink a day. ? Be aware of how much alcohol is in your drink. In the [place] one drink equals one 12 oz bottle of beer (355 mL), one 5 oz glass of wine (148 mL), or one 1 oz glass of hard liquor (44 mL). Lifestyle  Take daily care of your teeth and gums.  Stay active. Exercise for at least 30 minutes on 5 or more days each week.  Do not use any products that contain nicotine or tobacco, such as cigarettes, e-cigarettes, and chewing tobacco. If you need help quitting, ask your health care provider.  If you are sexually active, practice safe sex. Use a condom or other form of birth control (contraception) in order to prevent pregnancy and STIs (sexually transmitted infections).  If told by your health care provider, take low-dose aspirin daily starting at age 19. What's next?  Visit your health care provider once a year for a well check visit.  Ask your health care provider how often you should have your eyes and teeth checked.  Stay up to date on all vaccines. This information is not intended to replace advice given to you by your health care provider. Make sure you discuss any questions you have with your health care provider. Document Revised: 08/09/2018 Document Reviewed: 08/09/2018 Elsevier Patient Education  [place].  Alcohol Abuse and Dependence Information, Adult Alcohol is a widely available drug. People drink alcohol in different amounts. People who drink alcohol very often and in large amounts often have problems during and after drinking. They may develop what is called an alcohol use disorder. There are two main types of alcohol use disorders:  Alcohol abuse. This is when you use alcohol too much or too often. You may use alcohol to make yourself feel happy or to reduce stress. You may have a hard time setting a limit on  the amount you drink.  Alcohol dependence. This is when you use alcohol consistently for a period of time, and your body changes as a result. This can make it hard to stop drinking because you may start to feel sick or feel different when you do not use alcohol. These symptoms are known as withdrawal. How can alcohol abuse and dependence affect me? Alcohol abuse and dependence can have a negative effect on your life. Drinking too much can lead to addiction. You may feel like you need alcohol to function normally. You may drink alcohol before work in the morning, during the day, or as soon as you get home from work in the evening. These actions can result in:  Poor work performance.  Job loss.  Financial problems.  Car crashes or criminal charges from driving after drinking alcohol.  Problems in your relationships with friends and family.  Losing the trust and respect of coworkers, friends, and family. Drinking heavily over a long period of time can permanently damage your body and brain, and can cause lifelong health issues, such as:  Damage to your liver or pancreas.  Heart problems, high blood pressure, or stroke.  Certain cancers.  Decreased ability to fight infections.  Brain or nerve damage.  Depression.  Early (premature) death. If you are careless or you crave alcohol, it is easy to drink more than your body can handle (overdose). Alcohol overdose is a serious situation that requires hospitalization. It may lead to permanent injuries or death. What can increase my risk?  Having a family history of alcohol abuse.  Having depression or other mental health conditions.  Beginning to drink at an early age.  Binge drinking often.  Experiencing trauma, stress, and an unstable home life during childhood.  Spending time with people who drink often. What actions can I take to prevent or manage alcohol abuse and dependence?  Do not drink alcohol if: ? Your health care  provider tells you not to drink. ? You are pregnant, may be pregnant, or are planning to become pregnant.  If you drink alcohol: ? Limit how much you use to:  0-1 drink a day for women.  0-2 drinks a day for men. ? Be aware of how much alcohol is in your drink. In the [place] one drink equals one 12 oz bottle of beer (355 mL), one 5 oz glass of wine (148 mL), or one 1 oz glass of hard liquor (44 mL).  Stop drinking if you have been drinking too much. This can be very hard to do if you are used to abusing alcohol. If you begin to have withdrawal symptoms, talk with your health care provider or a person that you trust. These symptoms may include anxiety, shaky hands, headache, nausea, sweating, or not being able to sleep.  Choose to drink nonalcoholic beverages in social gatherings and places where there may be alcohol. Activity  Spend more time on activities that you enjoy that do not involve alcohol, like hobbies or exercise.  Find healthy ways to cope with stress, such as exercise, meditation, or spending time with people you care about. General information  Talk to your family, coworkers, and friends about supporting you in your efforts to stop drinking. If they drink, ask them not to drink around you. Spend more time with people who do not drink alcohol.  If you think that you have an alcohol dependency problem: ? Tell friends or family about your concerns. ? Talk with your health care provider or another health professional about where to get help. ? Work with a Transport planner and a Regulatory affairs officer. ? Consider joining a support group for people who struggle with alcohol abuse and dependence. Where to find support   Your health care provider.  SMART Recovery: www.smartrecovery.org Therapy and support groups  Local treatment centers or chemical dependency counselors.  Local AA groups in your community: NicTax.com.pt Where to find more information  Centers for Disease  Control and Prevention: http://www.wolf.info/  National Institute on Alcohol Abuse and Alcoholism: http://www.bradshaw.com/  Alcoholics Anonymous (AA): NicTax.com.pt Contact a health care provider if:  You drank more or for longer than you intended on more than one occasion.  You tried to stop drinking or to cut back on how much you drink, but you were not able to.  You often drink to the point of vomiting or passing out.  You want to drink so badly that you cannot think about anything else.  You have problems in your life due to drinking, but you continue to drink.  You keep drinking even though you feel anxious, depressed, or have experienced memory loss.  You have stopped doing the things you used to enjoy in order to drink.  You have to drink more than you  used to in order to get the effect you want.  You experience anxiety, sweating, nausea, shakiness, and trouble sleeping when you try to stop drinking. Get help right away if:  You have thoughts about hurting yourself or others.  You have serious withdrawal symptoms, including: ? Confusion. ? Racing heart. ? High blood pressure. ? Fever. If you ever feel like you may hurt yourself or others, or have thoughts about taking your own life, get help right away. You can go to your nearest emergency department or call:  Your local emergency services (911 in the [place]  A suicide crisis helpline, such as the [place] at 8065642021. This is open 24 hours a day. Summary  Alcohol abuse and dependence can have a negative effect on your life. Drinking too much or too often can lead to addiction.  If you drink alcohol, limit how much you use.  If you are having trouble keeping your drinking under control, find ways to change your behavior. Hobbies, calming activities, exercise, or support groups can help.  If you feel you need help with changing your drinking habits, talk with your health care provider, a good friend,  or a therapist, or go to an [facility] group. This information is not intended to replace advice given to you by your health care provider. Make sure you discuss any questions you have with your health care provider. Document Revised: 03/19/2019 Document Reviewed: 02/05/2019 Elsevier Patient Education  [place].

## 2020-02-14 NOTE — Progress Notes (Signed)
New Patient Office Visit  Subjective:  Patient ID: [name], female    DOB: 1961-06-02  Age: 59 y.o. MRN: [id number]  CC:  Chief Complaint  Patient presents with  . Establish Care    c/o hair loss x 1 month, stomach issues poor appetite.     HPI [name] presents for establishment of care, physical exam and follow-up of multiple problems that she has been experiencing.  She lives with her husband who manufactures duct work in a facility on their property.  She stays at home or self and watches 3-4 grandchildren daily.  She has been doing this for the last several years.  She has not had medical care for some 10 years now.  She has no history of hypertension.  She does smoke a pack a day and more in the past.  She started smoking when she was 20.  She does drink 3-4 beers daily with more on Friday nights after her grandchildren go home.  She does not use illicit drugs.  She does not exercise regularly.  No recent dental care.  She has been losing a good deal of hair.  She admits to being stressed and depressed.  She has feels as though her husband is as needy as her grandchildren.  She has never had a colonoscopy.  She has ongoing left lower quadrant abdominal pain and loose stools.  She is status post bilateral oophorectomy for ovarian cyst.  History reviewed. No pertinent past medical history.  Past Surgical History:  Procedure Laterality Date  . ABDOMINAL HYSTERECTOMY Bilateral 08/12/2010    Family History  Problem Relation Age of Onset  . Healthy Mother   . Cancer Father     Social History   Socioeconomic History  . Marital status: Married    Spouse name: Not on file  . Number of children: Not on file  . Years of education: Not on file  . Highest education level: Not on file  Occupational History  . Not on file  Tobacco Use  . Smoking status: Current Every Day Smoker    Types: Cigarettes  . Smokeless tobacco: Current User  Substance and Sexual Activity  .  Alcohol use: Yes    Alcohol/week: 2.0 standard drinks    Types: 2 Cans of beer per week  . Drug use: Never  . Sexual activity: Not on file  Other Topics Concern  . Not on file  Social History Narrative  . Not on file   Social Determinants of Health   Financial Resource Strain:   . Difficulty of Paying Living Expenses: Not on file  Food Insecurity:   . Worried About Programme researcher, broadcasting/film/video in the Last Year: Not on file  . Ran Out of Food in the Last Year: Not on file  Transportation Needs:   . Lack of Transportation (Medical): Not on file  . Lack of Transportation (Non-Medical): Not on file  Physical Activity:   . Days of Exercise per Week: Not on file  . Minutes of Exercise per Session: Not on file  Stress:   . Feeling of Stress : Not on file  Social Connections:   . Frequency of Communication with Friends and Family: Not on file  . Frequency of Social Gatherings with Friends and Family: Not on file  . Attends Religious Services: Not on file  . Active Member of Clubs or Organizations: Not on file  . Attends Banker Meetings: Not on file  .  Marital Status: Not on file  Intimate Partner Violence:   . Fear of Current or Ex-Partner: Not on file  . Emotionally Abused: Not on file  . Physically Abused: Not on file  . Sexually Abused: Not on file    ROS Review of Systems  Constitutional: Negative.  Negative for diaphoresis, fatigue and unexpected weight change.  HENT: Negative.   Eyes: Negative for photophobia and visual disturbance.  Respiratory: Negative for chest tightness, shortness of breath and wheezing.   Cardiovascular: Negative for chest pain and palpitations.  Gastrointestinal: Positive for abdominal pain. Negative for blood in stool, constipation, nausea and vomiting.  Endocrine: Negative for polyphagia and polyuria.  Genitourinary: Negative.   Musculoskeletal: Negative for gait problem.  Skin: Negative for pallor and rash.  Allergic/Immunologic:  Negative for immunocompromised state.  Neurological: Negative for tremors and speech difficulty.  Hematological: Does not bruise/bleed easily.  Psychiatric/Behavioral: Positive for dysphoric mood. The patient is nervous/anxious.    Depression screen PHQ 2/9 02/14/2020  Decreased Interest 1  Down, Depressed, Hopeless 1  PHQ - 2 Score 2     Objective:   Today's Vitals: BP (!) 168/108   Pulse 97   Temp 97.7 F (36.5 C) (Tympanic)   Ht 5\' 3"  (1.6 m)   Wt 139 lb 3.2 oz (63.1 kg)   SpO2 97%   BMI 24.66 kg/m   Physical Exam Vitals and nursing note reviewed.  Constitutional:      General: She is not in acute distress.    Appearance: Normal appearance. She is not ill-appearing, toxic-appearing or diaphoretic.  HENT:     Head: Normocephalic and atraumatic.     Right Ear: Tympanic membrane, ear canal and external ear normal.     Left Ear: Tympanic membrane, ear canal and external ear normal.     Mouth/Throat:     Pharynx: No oropharyngeal exudate or posterior oropharyngeal erythema.  Eyes:     General: No scleral icterus.       Right eye: No discharge.        Left eye: No discharge.     Extraocular Movements: Extraocular movements intact.     Conjunctiva/sclera: Conjunctivae normal.     Pupils: Pupils are equal, round, and reactive to light.  Neck:     Vascular: No carotid bruit.  Cardiovascular:     Rate and Rhythm: Normal rate and regular rhythm.  Pulmonary:     Effort: Pulmonary effort is normal.     Breath sounds: Normal breath sounds.  Abdominal:     General: Bowel sounds are normal. There is distension.     Palpations: Abdomen is soft. There is no mass.     Tenderness: There is no abdominal tenderness. There is no guarding or rebound.     Hernia: No hernia is present.  Musculoskeletal:     Cervical back: No rigidity or tenderness.     Right lower leg: No edema.     Left lower leg: No edema.  Lymphadenopathy:     Cervical: No cervical adenopathy.  Skin:        Neurological:     Mental Status: She is alert and oriented to person, place, and time.  Psychiatric:        Mood and Affect: Mood normal.        Behavior: Behavior normal.     Assessment & Plan:   Problem List Items Addressed This Visit      Musculoskeletal and Integument   Alopecia areata   Relevant  Orders   Ambulatory referral to Dermatology     Other   Elevated BP without diagnosis of hypertension   Relevant Orders   Comprehensive metabolic panel   Healthcare maintenance - Primary   Relevant Orders   CBC   Comprehensive metabolic panel   Lipid panel   TSH   Urinalysis, Routine w reflex microscopic   Ambulatory referral to Gastroenterology   Ambulatory referral to Gynecology   Tdap vaccine greater than or equal to 7yo IM (Completed)   Need for Tdap vaccination   Relevant Orders   Tdap vaccine greater than or equal to 7yo IM (Completed)   Depression with anxiety   Relevant Medications   PARoxetine (PAXIL) 10 MG tablet   Alcohol use   Relevant Orders   Comprehensive metabolic panel   Gamma GT      Outpatient Encounter Medications as of 02/14/2020  Medication Sig  . PARoxetine (PAXIL) 10 MG tablet Take one daily for one week and then increase to 2 daily.   No facility-administered encounter medications on file as of 02/14/2020.    Follow-up: Return in about 1 month (around 03/16/2020).   Patient agrees to give Paxil a try.  We discussed dosing and time of day.  She will follow-up in a month and we will recheck her blood pressure.  We discussed alcohol and I expressed my concern about her current usage.  We will strive going forward to catch her up on her health maintenance.  Consultations pending with GYN and gastroenterology and dermatology.  Patient was given information on health maintenance, and alcohol abuse.  Also given information on alopecia areata as well as preventing hypertension.  Mliss Sax, MD

## 2020-03-11 ENCOUNTER — Telehealth: Payer: Self-pay | Admitting: Radiology

## 2020-03-11 NOTE — Telephone Encounter (Signed)
Someone called back and stated that this cell is the incorrect number, unable to vm on home phone, rand busy

## 2020-03-11 NOTE — Telephone Encounter (Signed)
Left message on vm for patient to call [facility] [place] to schedule appointment from referral

## 2020-03-20 ENCOUNTER — Ambulatory Visit: Payer: Self-pay | Admitting: Family Medicine

## 2020-03-25 ENCOUNTER — Telehealth: Payer: Self-pay | Admitting: Family Medicine

## 2020-03-25 NOTE — Telephone Encounter (Signed)
Attempted to call pt to reschedule appt cancelled from 03/20/20. [phone] is not this patient's cell number so I removed it from profile. [phone] had no answer or voicemail.

## 2020-04-15 ENCOUNTER — Encounter: Payer: Self-pay | Admitting: Family Medicine

## 2022-11-01 ENCOUNTER — Observation Stay: Payer: Self-pay

## 2022-11-01 ENCOUNTER — Encounter (HOSPITAL_COMMUNITY): Payer: Self-pay

## 2022-11-01 ENCOUNTER — Observation Stay
Admit: 2022-11-01 | Discharge: 2022-11-01 | Disposition: A | Discharge status: Home / Self Care | Payer: Self-pay | Attending: Neurology | Admitting: Neurology

## 2022-11-01 ENCOUNTER — Encounter: Payer: Self-pay | Admitting: Radiology

## 2022-11-01 ENCOUNTER — Other Ambulatory Visit: Payer: Self-pay

## 2022-11-01 ENCOUNTER — Inpatient Hospital Stay
Admission: EM | Admit: 2022-11-01 | Discharge: 2022-11-02 | DRG: 064 | Disposition: A | Discharge status: Other Acute Inpatient Hospital | Payer: Self-pay | Attending: Internal Medicine | Admitting: Internal Medicine

## 2022-11-01 ENCOUNTER — Emergency Department: Payer: Self-pay

## 2022-11-01 DIAGNOSIS — J439 Emphysema, unspecified: Secondary | ICD-10-CM | POA: Diagnosis present

## 2022-11-01 DIAGNOSIS — R531 Weakness: Secondary | ICD-10-CM

## 2022-11-01 DIAGNOSIS — M720 Palmar fascial fibromatosis [Dupuytren]: Secondary | ICD-10-CM | POA: Diagnosis present

## 2022-11-01 DIAGNOSIS — F1010 Alcohol abuse, uncomplicated: Secondary | ICD-10-CM | POA: Diagnosis present

## 2022-11-01 DIAGNOSIS — R29701 NIHSS score 1: Secondary | ICD-10-CM | POA: Diagnosis present

## 2022-11-01 DIAGNOSIS — Y902 Blood alcohol level of 40-59 mg/100 ml: Secondary | ICD-10-CM | POA: Diagnosis present

## 2022-11-01 DIAGNOSIS — Z720 Tobacco use: Secondary | ICD-10-CM | POA: Diagnosis present

## 2022-11-01 DIAGNOSIS — F32A Depression, unspecified: Secondary | ICD-10-CM | POA: Diagnosis present

## 2022-11-01 DIAGNOSIS — I63431 Cerebral infarction due to embolism of right posterior cerebral artery: Principal | ICD-10-CM | POA: Diagnosis present

## 2022-11-01 DIAGNOSIS — G8194 Hemiplegia, unspecified affecting left nondominant side: Secondary | ICD-10-CM | POA: Diagnosis present

## 2022-11-01 DIAGNOSIS — I6521 Occlusion and stenosis of right carotid artery: Secondary | ICD-10-CM | POA: Diagnosis present

## 2022-11-01 DIAGNOSIS — Z789 Other specified health status: Secondary | ICD-10-CM

## 2022-11-01 DIAGNOSIS — F419 Anxiety disorder, unspecified: Secondary | ICD-10-CM | POA: Diagnosis present

## 2022-11-01 DIAGNOSIS — Z8673 Personal history of transient ischemic attack (TIA), and cerebral infarction without residual deficits: Secondary | ICD-10-CM

## 2022-11-01 DIAGNOSIS — I63239 Cerebral infarction due to unspecified occlusion or stenosis of unspecified carotid arteries: Secondary | ICD-10-CM

## 2022-11-01 DIAGNOSIS — I10 Essential (primary) hypertension: Secondary | ICD-10-CM | POA: Diagnosis present

## 2022-11-01 DIAGNOSIS — R202 Paresthesia of skin: Secondary | ICD-10-CM

## 2022-11-01 DIAGNOSIS — I8290 Acute embolism and thrombosis of unspecified vein: Secondary | ICD-10-CM

## 2022-11-01 DIAGNOSIS — F17210 Nicotine dependence, cigarettes, uncomplicated: Secondary | ICD-10-CM | POA: Diagnosis present

## 2022-11-01 DIAGNOSIS — F418 Other specified anxiety disorders: Secondary | ICD-10-CM | POA: Diagnosis present

## 2022-11-01 DIAGNOSIS — I639 Cerebral infarction, unspecified: Secondary | ICD-10-CM | POA: Diagnosis present

## 2022-11-01 DIAGNOSIS — I612 Nontraumatic intracerebral hemorrhage in hemisphere, unspecified: Secondary | ICD-10-CM | POA: Diagnosis present

## 2022-11-01 HISTORY — DX: Alcohol abuse, uncomplicated: F10.10

## 2022-11-01 HISTORY — DX: Palmar fascial fibromatosis (dupuytren): M72.0

## 2022-11-01 HISTORY — DX: Essential (primary) hypertension: I10

## 2022-11-01 HISTORY — DX: Other specified anxiety disorders: F41.8

## 2022-11-01 HISTORY — DX: Tobacco use: Z72.0

## 2022-11-01 LAB — ETHANOL: Alcohol, Ethyl (B): 47 mg/dL — ABNORMAL HIGH (ref <10)

## 2022-11-01 LAB — URINE DRUG SCREEN, QUALITATIVE (ARMC ONLY)
Amphetamines, Ur Screen: NOT DETECTED
Barbiturates, Ur Screen: NOT DETECTED
Benzodiazepine, Ur Scrn: NOT DETECTED
Cannabinoid 50 Ng, Ur ~~LOC~~: NOT DETECTED
Cocaine Metabolite,Ur ~~LOC~~: NOT DETECTED
MDMA (Ecstasy)Ur Screen: NOT DETECTED
Methadone Scn, Ur: NOT DETECTED
Opiate, Ur Screen: NOT DETECTED
Phencyclidine (PCP) Ur S: NOT DETECTED
Tricyclic, Ur Screen: POSITIVE — AB

## 2022-11-01 LAB — ECHOCARDIOGRAM COMPLETE BUBBLE STUDY
AR max vel: 2.31 cm2
AV Area VTI: 2.62 cm2
AV Area mean vel: 2.22 cm2
AV Mean grad: 3 mmHg
AV Peak grad: 5.5 mmHg
Ao pk vel: 1.17 m/s
Area-P 1/2: 3.39 cm2
S' Lateral: 2.7 cm

## 2022-11-01 LAB — PROTIME-INR
INR: 1 (ref 0.8–1.2)
Prothrombin Time: 12.7 seconds (ref 11.4–15.2)

## 2022-11-01 LAB — URINALYSIS, ROUTINE W REFLEX MICROSCOPIC
Bilirubin Urine: NEGATIVE
Glucose, UA: NEGATIVE mg/dL
Hgb urine dipstick: NEGATIVE
Ketones, ur: NEGATIVE mg/dL
Leukocytes,Ua: NEGATIVE
Nitrite: NEGATIVE
Protein, ur: NEGATIVE mg/dL
Specific Gravity, Urine: 1.014 (ref 1.005–1.030)
pH: 5 (ref 5.0–8.0)

## 2022-11-01 LAB — COMPREHENSIVE METABOLIC PANEL
ALT: 30 U/L (ref 0–44)
AST: 42 U/L — ABNORMAL HIGH (ref 15–41)
Albumin: 4 g/dL (ref 3.5–5.0)
Alkaline Phosphatase: 94 U/L (ref 38–126)
Anion gap: 7 (ref 5–15)
BUN: 8 mg/dL (ref 6–20)
CO2: 24 mmol/L (ref 22–32)
Calcium: 9 mg/dL (ref 8.9–10.3)
Chloride: 109 mmol/L (ref 98–111)
Creatinine, Ser: 0.59 mg/dL (ref 0.44–1.00)
GFR, Estimated: >60 mL/min (ref >60)
Glucose, Bld: 84 mg/dL (ref 70–99)
Potassium: 3.9 mmol/L (ref 3.5–5.1)
Sodium: 140 mmol/L (ref 135–145)
Total Bilirubin: 0.5 mg/dL (ref 0.3–1.2)
Total Protein: 7.7 g/dL (ref 6.5–8.1)

## 2022-11-01 LAB — CBC
HCT: 45.1 % (ref 36.0–46.0)
Hemoglobin: 15.4 g/dL — ABNORMAL HIGH (ref 12.0–15.0)
MCH: 33.4 pg (ref 26.0–34.0)
MCHC: 34.1 g/dL (ref 30.0–36.0)
MCV: 97.8 fL (ref 80.0–100.0)
Platelets: 272 10*3/uL (ref 150–400)
RBC: 4.61 MIL/uL (ref 3.87–5.11)
RDW: 12.7 % (ref 11.5–15.5)
WBC: 4.7 10*3/uL (ref 4.0–10.5)
nRBC: 0 % (ref 0.0–0.2)

## 2022-11-01 LAB — PREGNANCY, URINE: Preg Test, Ur: NEGATIVE

## 2022-11-01 LAB — DIFFERENTIAL
Abs Immature Granulocytes: 0.01 10*3/uL (ref 0.00–0.07)
Basophils Absolute: 0.1 10*3/uL (ref 0.0–0.1)
Basophils Relative: 2 %
Eosinophils Absolute: 0.3 10*3/uL (ref 0.0–0.5)
Eosinophils Relative: 5 %
Immature Granulocytes: 0 %
Lymphocytes Relative: 42 %
Lymphs Abs: 2 10*3/uL (ref 0.7–4.0)
Monocytes Absolute: 0.4 10*3/uL (ref 0.1–1.0)
Monocytes Relative: 9 %
Neutro Abs: 2 10*3/uL (ref 1.7–7.7)
Neutrophils Relative %: 42 %

## 2022-11-01 LAB — HEMOGLOBIN A1C
Hgb A1c MFr Bld: 5 % (ref 4.8–5.6)
Mean Plasma Glucose: 96.8 mg/dL

## 2022-11-01 LAB — T4, FREE: Free T4: 0.64 ng/dL (ref 0.61–1.12)

## 2022-11-01 LAB — VITAMIN B12: Vitamin B-12: 179 pg/mL — ABNORMAL LOW (ref 180–914)

## 2022-11-01 LAB — HEPARIN LEVEL (UNFRACTIONATED): Heparin Unfractionated: 0.41 IU/mL (ref 0.30–0.70)

## 2022-11-01 LAB — APTT: aPTT: 26 seconds (ref 24–36)

## 2022-11-01 LAB — TSH: TSH: 2.389 u[IU]/mL (ref 0.350–4.500)

## 2022-11-01 MED ORDER — STROKE: EARLY STAGES OF RECOVERY BOOK: Freq: Once | Status: DC

## 2022-11-01 MED ORDER — HYDRALAZINE HCL 20 MG/ML IJ SOLN: 5.0000 mg | INTRAMUSCULAR | Status: DC | PRN

## 2022-11-01 MED ORDER — CLOPIDOGREL BISULFATE 75 MG PO TABS
75.0000 mg | ORAL_TABLET | Freq: Every day | ORAL | Status: DC
  Administered 2022-11-01: 75 mg via ORAL
  Filled 2022-11-01: qty 1

## 2022-11-01 MED ORDER — THIAMINE HCL 100 MG/ML IJ SOLN
100.0000 mg | Freq: Every day | INTRAMUSCULAR | Status: DC
  Filled 2022-11-01: qty 2

## 2022-11-01 MED ORDER — ONDANSETRON HCL 4 MG/2ML IJ SOLN: 4.0000 mg | Freq: Three times a day (TID) | INTRAMUSCULAR | Status: DC | PRN

## 2022-11-01 MED ORDER — ACETAMINOPHEN 160 MG/5ML PO SOLN: 650.0000 mg | ORAL | Status: DC | PRN

## 2022-11-01 MED ORDER — FOLIC ACID 1 MG PO TABS
1.0000 mg | ORAL_TABLET | Freq: Every day | ORAL | Status: DC
  Administered 2022-11-01: 1 mg via ORAL
  Filled 2022-11-01: qty 1

## 2022-11-01 MED ORDER — LORAZEPAM 1 MG PO TABS: 1.0000 mg | ORAL_TABLET | ORAL | Status: DC | PRN

## 2022-11-01 MED ORDER — THIAMINE MONONITRATE 100 MG PO TABS
100.0000 mg | ORAL_TABLET | Freq: Every day | ORAL | Status: DC
  Administered 2022-11-01: 100 mg via ORAL
  Filled 2022-11-01: qty 1

## 2022-11-01 MED ORDER — ACETAMINOPHEN 325 MG PO TABS: 650.0000 mg | ORAL_TABLET | ORAL | Status: DC | PRN

## 2022-11-01 MED ORDER — FOLIC ACID 1 MG PO TABS: 1.0000 mg | ORAL_TABLET | Freq: Once | ORAL | Status: DC

## 2022-11-01 MED ORDER — ENOXAPARIN SODIUM 40 MG/0.4ML IJ SOSY: 40.0000 mg | PREFILLED_SYRINGE | INTRAMUSCULAR | Status: DC

## 2022-11-01 MED ORDER — THIAMINE HCL 100 MG/ML IJ SOLN: 100.0000 mg | Freq: Once | INTRAMUSCULAR | Status: DC

## 2022-11-01 MED ORDER — HEPARIN (PORCINE) 25000 UT/250ML-% IV SOLN
900.0000 [IU]/h | INTRAVENOUS | Status: DC
  Administered 2022-11-01: 900 [IU]/h via INTRAVENOUS
  Filled 2022-11-01: qty 250

## 2022-11-01 MED ORDER — HYDRALAZINE HCL 20 MG/ML IJ SOLN
5.0000 mg | INTRAMUSCULAR | Status: DC | PRN
  Administered 2022-11-01: 5 mg via INTRAVENOUS
  Filled 2022-11-01: qty 1

## 2022-11-01 MED ORDER — SENNOSIDES-DOCUSATE SODIUM 8.6-50 MG PO TABS: 1.0000 | ORAL_TABLET | Freq: Every evening | ORAL | Status: DC | PRN

## 2022-11-01 MED ORDER — IOHEXOL 350 MG/ML SOLN
75.0000 mL | Freq: Once | INTRAVENOUS | Status: AC | PRN
  Administered 2022-11-01: 50 mL via INTRAVENOUS

## 2022-11-01 MED ORDER — ADULT MULTIVITAMIN W/MINERALS CH
1.0000 | ORAL_TABLET | Freq: Every day | ORAL | Status: DC
  Filled 2022-11-01: qty 1

## 2022-11-01 MED ORDER — LORAZEPAM 2 MG/ML IJ SOLN: 1.0000 mg | INTRAMUSCULAR | Status: DC | PRN

## 2022-11-01 MED ORDER — ACETAMINOPHEN 325 MG RE SUPP: 650.0000 mg | RECTAL | Status: DC | PRN

## 2022-11-01 MED ORDER — ASPIRIN 81 MG PO CHEW
81.0000 mg | CHEWABLE_TABLET | Freq: Every day | ORAL | Status: DC
  Administered 2022-11-01: 81 mg via ORAL
  Filled 2022-11-01: qty 1

## 2022-11-01 MED ORDER — NICOTINE 21 MG/24HR TD PT24
21.0000 mg | MEDICATED_PATCH | Freq: Every day | TRANSDERMAL | Status: DC
  Administered 2022-11-01: 21 mg via TRANSDERMAL
  Filled 2022-11-01: qty 1

## 2022-11-01 NOTE — Discharge Summary (Addendum)
Physician Discharge Summary  [name]:[id number] DOB: 09-29-1961 DOA: 11/01/2022  PCP: Pcp, No  Admit date: 11/01/2022 Discharge date: 11/01/2022  Recommendations for Outpatient Follow-up:  -Transfer to [facility] neuro ICU  Home Health: none Equipment/Devices: none  Discharge Condition: stable CODE STATUS: full   Diet recommendation: NPO now, will need heart healthy diet  Brief/Interim Summary (HPI)  HPI: KHRISTEN Bird is a 61 y.o. female with medical history significant of hypertension, depression with anxiety, alcohol abuse, tobacco abuse, Dupuytren contracture in both hands, who presents with left arm numbness and heaviness.     Patient states that she has right arm numbness and heaviness for several weeks, which seems to have worsened today.  Her leg feels normal.  Patient does not have slurred speech vision loss or difficulty swallowing.  Patient denies chest pain, cough, shortness breath.  No nausea vomiting, diarrhea or abdominal pain.  No symptoms of UTI.     Data reviewed independently and ED Course: pt was found to have WBC 4.7, INR 1.0, PTT 26, negative pregnancy test, negative urinalysis, alcohol level 47, GFR> 60.  Temperature normal, blood pressure 155/87, heart rate 74, RR 18, oxygen saturation 100% on room air.  Patient is placed on telemetry bed for patient, Dr. Selina Cooley of neurology is consulted.   CT head: Atrophy with small vessel chronic ischemic changes of deep cerebral white matter.   Small old cortical infarct high RIGHT parietal region.   Additional likely subacute infarct posterior RIGHT parietal region; this could be better assessed by MR.   No acute intracranial abnormalities.     CTA of head and neck: 1. 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 But the Right ICA and anterior circulation remain patent. No superimposed large  vessel occlusion.   2. Positive also for moderate to severe atherosclerotic stenosis at the Left Vertebral Artery origin, with evidence of asymmetrically decreased flow in the left vertebral compared to the right.   3. Proximal right ICA and bilateral ICA siphon atherosclerosis but no other significant arterial stenosis in the head or neck.   4.  Emphysema (ICD10-J43.9).     EKG: I have personally reviewed.  Sinus rhythm, QTc 382, LAE.      Subjective  -Left arm numbness and heaviness  Discharge Diagnoses and Hospital Course:   Principal Problem:   Stroke [facility]) Active Problems:   HTN (hypertension)   Depression with anxiety   Tobacco abuse   Alcohol abuse   Embolic stroke (HCC)   Stroke [facility]): CT-head showed small old cortical infarct high RIGHT parietal region, possible subacute infarct posterior RIGHT parietal region. CTA showed THROMBUS within the Right ICA origin and bulb, appears adherent to Right ICA origin plaque with subsequent relatively High-grade stenosis. Dr. Selina Cooley of neuro is consulted. Per Dr. Selina Cooley, pt will need to be transferred to [facility] neuro ICU   -Placed on tele bed for observation - Obtain MRI - fasting lipid panel and HbA1c  - 2D transthoracic echocardiography  - swallowing screen. If fails, will get SLP - Check UDS  - PT/OT consult - IV heparin    HTN (hypertension): Patient's not taking medications currently.  Blood pressure 155/87, 140/97 -IV hydralazine as needed for SBP> 180/105 per Dr. Selina Cooley   Depression with anxiety: Not taking medications currently.  No suicidal homicidal ideations. -Observe closely   Tobacco abuse and alcohol abuse:  -Did counseling about importance  of quitting tobacco and alcohol use -CIWA protocol -Nicotine patch    Discharge Instructions   Allergies as of 11/01/2022   No Known Allergies   Med Rec must be completed prior to using this SM[facility]       No Known  Allergies  Consultations: Neurology   Procedures/Studies: MR BRAIN WO CONTRAST  Result Date: 11/01/2022 CLINICAL DATA:  Stroke suspected. EXAM: MRI HEAD WITHOUT CONTRAST TECHNIQUE: Multiplanar, multiecho pulse sequences of the brain and surrounding structures were obtained without intravenous contrast. COMPARISON:  Same-day CT brain and head and neck angiogram. FINDINGS: Brain: There are multiple acute infarcts in the right MCA territory involving the right frontal lobe (series 5, image 25), right parietal lobe (series 5, image 37-34, 28, 25), in the posterior aspect of the right temporal lobe (series 5, image 20). There is an additional small punctate focus of acute infarct in the posteroinferior right occipital c lobe, which is likely in the right PCA territory (series 5, image 14). There is a chronic infarct in the high right parietal lobe (series 15, image 42). There is sequela of mild chronic microvascular ischemic change. There is a small amount of petechial hemorrhage associated with infarcts in the low right parietal lobe. No large parenchymal hematoma is visualized. Vascular: Normal flow voids. Skull and upper cervical spine: Normal marrow signal. Sinuses/Orbits: Negative. Other: None IMPRESSION: 1. Multiple acute infarcts in the right MCA territory involving the right frontal lobe, right parietal lobe, and posterior aspect of the right temporal lobe. Given findings on same-day CTA head/neck angiogram, these are favored to be embolic. 2. There is an additional small punctate focus of acute infarct in the right PCA territory involving the right occipital lobe. 3. Small amount of petechial hemorrhage associated with infarcts in the low right parietal lobe. No large parenchymal hematoma is visualized. Electronically Signed   By: Lorenza CambridgeHemant  Desai M.D.   On: 11/01/2022 12:54   CT ANGIO HEAD NECK W WO CM  Addendum Date: 11/01/2022   ADDENDUM REPORT: 11/01/2022 09:54 ADDENDUM: #1 was discussed by  telephone with Dr. Pilar JarvisSILAS WONG on 11/01/2022 at 0937 hours. Electronically Signed   By: Odessa FlemingH  Hall M.D.   On: 11/01/2022 09:54   Result Date: 11/01/2022 CLINICAL DATA:  61 year old female with left side numbness, tingling, paresthesia. EXAM: CT ANGIOGRAPHY HEAD AND NECK TECHNIQUE: Multidetector CT imaging of the head and neck was performed using the standard protocol during bolus administration of intravenous contrast. Multiplanar CT image reconstructions and MIPs were obtained to evaluate the vascular anatomy. Carotid stenosis measurements (when applicable) are obtained utilizing NASCET criteria, using the distal internal carotid diameter as the denominator. RADIATION DOSE REDUCTION: This exam was performed according to the departmental dose-optimization program which includes automated exposure control, adjustment of the mA and/or kV according to patient size and/or use of iterative reconstruction technique. CONTRAST:  50mL OMNIPAQUE IOHEXOL 350 MG/ML SOLN COMPARISON:  Plain head CT today 0837 hours. FINDINGS: CTA NECK Skeleton: Cervical spine degeneration with some bulky endplate changes. No acute osseous abnormality identified. Congenital incomplete ossification of the posterior C1 ring. Upper chest: Asymmetric centrilobular emphysema and scarring in the right upper lobe. Negative visible superior mediastinum. Other neck: No acute finding. Aortic arch: 3 vessel arch configuration. Mild arch tortuosity. Minimal arch atherosclerosis. Right carotid system: Mildly tortuous brachiocephalic artery with some soft plaque but no stenosis. Tortuous proximal right CCA. Minimal right CCA plaque. But at the right ICA origin there is fairly bulky soft and atherosclerotic plaque with evidence of adherent  thrombus and thrombus located centrally within the right ICA bulb. See series 4, image 94 and series 10, image 46. Subsequent high-grade stenosis. But the right ICA remains patent to the skull base. Left carotid system: Mildly  tortuous left CCA with no significant plaque or stenosis. Soft plaque at the posterior left ICA origin and calcified plaque at the distal right ICA bulb. Less than 50 % stenosis with respect to the distal vessel. Tortuous left ICA below the skull base. Vertebral arteries: Soft plaque at the right subclavian artery origin without stenosis. Normal right vertebral artery origin. Right vertebral artery appears patent and dominant to the skull base without stenosis. Proximal left subclavian artery soft plaque without stenosis. Soft plaque and moderate or severe stenosis at the left vertebral artery origin on series 8, image 118. The left vertebral is non dominant, diminutive but patent to the skull base. There is subtle asymmetric decreased enhancement of the left vertebral compared to the right. CTA HEAD Posterior circulation: Relatively dominant although normal caliber right vertebral V4 segment is patent to the basilar without stenosis. Patent right PICA origin. Patent but asymmetrically decreased enhancement of the left vertebral V4 segment, which is diminutive beyond the left PICA which remains patent. Small contribution to the vertebrobasilar junction. Patent although diminutive Basilar artery. Patent SCA and left PCA origins. Fetal type right PCA origin. Diminutive or absent left posterior communicating artery. Bilateral PCA branches are patent with no definite stenosis. Anterior circulation: Both ICA siphons are patent with fairly symmetric enhancement. Mild to moderate bilateral siphon calcified plaque with no significant stenosis. Normal right posterior communicating artery origin. Patent carotid termini. Patent MCA and ACA origins. Dominant left and diminutive right ACA A1 segments. Anterior communicating artery is also diminutive. Left ACA A2 somewhat dominant, but bilateral ACA branches appear patent without stenosis. Left MCA M1 segment and trifurcation are patent without stenosis. Left MCA branches are  within normal limits. Right MCA M1 segment and bifurcation are patent without stenosis. Right MCA branches appear within normal limits, no convincing branch occlusion. Venous sinuses: Early contrast timing, not well evaluated. Anatomic variants: Fetal type right PCA origin. Dominant appearing right vertebral artery, although may beyond the basis of left vertebral origin stenosis. Dominant left and diminutive right ACA A1 segments. Review of the MIP images confirms the above findings IMPRESSION: 1. 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 But the Right ICA and anterior circulation remain patent. No superimposed large vessel occlusion. 2. Positive also for moderate to severe atherosclerotic stenosis at the Left Vertebral Artery origin, with evidence of asymmetrically decreased flow in the left vertebral compared to the right. 3. Proximal right ICA and bilateral ICA siphon atherosclerosis but no other significant arterial stenosis in the head or neck. 4.  Emphysema (ICD10-J43.9). Electronically Signed: By: Odessa Fleming M.D. On: 11/01/2022 09:35   CT HEAD WO CONTRAST  Result Date: 11/01/2022 CLINICAL DATA:  LEFT side numbness and tingling, paresthesias, LEFT arm heaviness, symptoms for last few weeks EXAM: CT HEAD WITHOUT CONTRAST TECHNIQUE: Contiguous axial images were obtained from the base of the skull through the vertex without intravenous contrast. RADIATION DOSE REDUCTION: This exam was performed according to the departmental dose-optimization program which includes automated exposure control, adjustment of the mA and/or kV according to patient size and/or use of iterative reconstruction technique. COMPARISON:  04/07/2006 FINDINGS: Brain: Generalized atrophy. Normal ventricular morphology. No midline shift or mass effect. Small old  cortical infarct high RIGHT parietal region. Additional likely subacute  infarct posterior RIGHT parietal region. Minor small vessel chronic ischemic changes of deep cerebral white matter. No intracranial hemorrhage, mass lesion, or definite acute infarction. No extra-axial fluid collections. Vascular: No hyperdense vessels. Minimal atherosclerotic calcifications of internal carotid arteries at skull base Skull: Intact Sinuses/Orbits: Clear Other: N/A IMPRESSION: Atrophy with small vessel chronic ischemic changes of deep cerebral white matter. Small old cortical infarct high RIGHT parietal region. Additional likely subacute infarct posterior RIGHT parietal region; this could be better assessed by MR. No acute intracranial abnormalities. Electronically Signed   By: Ulyses Southward M.D.   On: 11/01/2022 08:43      Discharge Exam: Vitals:   11/01/22 1100 11/01/22 1327  BP: (!) 140/97 (!) 193/91  Pulse: 72 73  Resp: 12 20  Temp:    SpO2: 100% 98%   Vitals:   11/01/22 1000 11/01/22 1030 11/01/22 1100 11/01/22 1327  BP: (!) 147/90 (!) 170/112 (!) 140/97 (!) 193/91  Pulse: 72 66 72 73  Resp: (!) 21 10 12 20   Temp:      SpO2: 99% 99% 100% 98%  Weight:      Height:        General: Not in acute distress HEENT:       Eyes: PERRL, EOMI, no scleral icterus.       ENT: No discharge from the ears and nose, no pharynx injection, no tonsillar enlargement.        Neck: No JVD, no bruit, no mass felt. Heme: No neck lymph node enlargement. Cardiac: S1/S2, RRR, No murmurs, No gallops or rubs. Respiratory: No rales, wheezing, rhonchi or rubs. GI: Soft, nondistended, nontender, no rebound pain, no organomegaly, BS present. GU: No hematuria Ext: No pitting leg edema bilaterally. 1+DP/PT pulse bilaterally. Musculoskeletal: No joint deformities, No joint redness or warmth, no limitation of ROM in spin. Skin: No rashes.  Neuro: Alert, oriented X3, cranial nerves II-XII grossly intact, moves all extremities normally.  Left arm is slightly weaker Psych: Patient is not psychotic, no  suicidal or hemocidal ideation.     The results of significant diagnostics from this hospitalization (including imaging, microbiology, ancillary and laboratory) are listed below for reference.     Microbiology: No results found for this or any previous visit (from the past 240 hour(s)).   Labs: BNP (last 3 results) No results for input(s): "BNP" in the last 8760 hours. Basic Metabolic Panel: Recent Labs  Lab 11/01/22 0829  NA 140  K 3.9  CL 109  CO2 24  GLUCOSE 84  BUN 8  CREATININE 0.59  CALCIUM 9.0   Liver Function Tests: Recent Labs  Lab 11/01/22 0829  AST 42*  ALT 30  ALKPHOS 94  BILITOT 0.5  PROT 7.7  ALBUMIN 4.0   No results for input(s): "LIPASE", "AMYLASE" in the last 168 hours. No results for input(s): "AMMONIA" in the last 168 hours. CBC: Recent Labs  Lab 11/01/22 0829  WBC 4.7  NEUTROABS 2.0  HGB 15.4*  HCT 45.1  MCV 97.8  PLT 272   Cardiac Enzymes: No results for input(s): "CKTOTAL", "CKMB", "CKMBINDEX", "TROPONINI" in the last 168 hours. BNP: Invalid input(s): "POCBNP" CBG: No results for input(s): "GLUCAP" in the last 168 hours. D-Dimer No results for input(s): "DDIMER" in the last 72 hours. Hgb A1c No results for input(s): "HGBA1C" in the last 72 hours. Lipid Profile No results for input(s): "CHOL", "HDL", "LDLCALC", "TRIG", "CHOLHDL", "LDLDIRECT" in the last 72 hours.  Thyroid function studies Recent Labs    11/01/22 0829  TSH 2.389   Anemia work up No results for input(s): "VITAMINB12", "FOLATE", "FERRITIN", "TIBC", "IRON", "RETICCTPCT" in the last 72 hours. Urinalysis    Component Value Date/Time   COLORURINE YELLOW (A) 11/01/2022 0852   APPEARANCEUR CLEAR (A) 11/01/2022 0852   LABSPEC 1.014 11/01/2022 0852   PHURINE 5.0 11/01/2022 0852   GLUCOSEU NEGATIVE 11/01/2022 0852   GLUCOSEU NEGATIVE 02/14/2020 1145   HGBUR NEGATIVE 11/01/2022 0852   BILIRUBINUR NEGATIVE 11/01/2022 0852   KETONESUR NEGATIVE 11/01/2022 0852    PROTEINUR NEGATIVE 11/01/2022 0852   UROBILINOGEN 0.2 02/14/2020 1145   NITRITE NEGATIVE 11/01/2022 0852   LEUKOCYTESUR NEGATIVE 11/01/2022 0852   Sepsis Labs Recent Labs  Lab 11/01/22 0829  WBC 4.7   Microbiology No results found for this or any previous visit (from the past 240 hour(s)).  Time coordinating discharge:  25 minutes.    SIGNED:  Lorretta Harp, MD [facility] 11/01/2022, 1:37 PM   If 7PM-7AM, please contact night-coverage [email or web address]

## 2022-11-01 NOTE — ED Provider Triage Note (Signed)
Emergency Medicine Provider Triage Evaluation Note  [name] , a 61 y.o. female  was evaluated in triage.  Pt complains of numbness/weakness  Review of Systems  Positive: Numbness/weakness Negative: Fever   Physical Exam  BP (!) 155/87   Pulse 74   Temp 98.3 F (36.8 C)   Resp 18   SpO2 100%  Gen:   Awake, no distress   Resp:  Normal effort  MSK:   Moves extremities withut difficulty  Other:    Medical Decision Making  Medically screening exam initiated at 8:26 AM.  Appropriate orders placed.  Crista Luria was informed that the remainder of the evaluation will be completed by another provider, this initial triage assessment does not replace that evaluation, and the importance of remaining in the ED until their evaluation is complete.  Patient adamant that symptoms  is constant for 2 to 3 weeks.  She denies even 5 minutes of resolution of symptoms recently reports going to bed yesterday with the symptoms.  Stroke code was not called given constant symptoms over 24 hours.      Concha Se, MD 11/01/22 [id number]

## 2022-11-01 NOTE — Evaluation (Signed)
Occupational Therapy Evaluation Patient Details Name: [name] MRN: [id number] DOB: [birth year] Today's Date: 11/01/2022   History of Present Illness Pt admitted to [facility] on 11/01/22 for c/o stroke like symptoms including: L sided weakness and numbness over several weeks. No new symptoms in the last several days. PMH significant for current smoker. Imaging revealed old R parietal infarct and subacute posterior R parietal infarct.   Clinical Impression   [name] was seen for OT/PT co-evaluation this date. Prior to hospital admission, pt was independent in all aspects of ADL/IADL and denies falls history in past 12 months. Pt reports living with her adult child, grandchildren, and elderly mother. 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 Pt lives in a 1 level mobile home with 4 steps to enter and bilateral hand rails. Currently pt reporting symptoms have gnerally resolved despite some ongoing weakness/numbness in her LUE. No focal weakness appreciated upon assessment (LUE/RUE 5/5 t/o) however pt puts forth increased effort with MMT on her L side. Pt demonstrates baseline independence to perform ADL and mobility tasks and no additional strength, sensory, coordination, cognitive, or visual deficits appreciated with assessment. No skilled OT needs identified. Will sign off. Please re-consult if additional OT needs arise during this hospital stay.       Recommendations for follow up therapy are one component of a multi-disciplinary discharge planning process, led by the attending physician.  Recommendations may be updated based on patient status, additional functional criteria and insurance authorization.   Follow Up Recommendations  Outpatient OT     Assistance Recommended at Discharge PRN  Patient can return home with the following      Functional Status Assessment  Patient has not had a recent decline  in their functional status  Equipment Recommendations  None recommended by OT    Recommendations for Other Services       Precautions / Restrictions Precautions Precautions: Fall Precaution Comments: Low fall      Mobility Bed Mobility Overal bed mobility: Independent                  Transfers Overall transfer level: Independent                        Balance Overall balance assessment: Independent                                         ADL either performed or assessed with clinical judgement   ADL Overall ADL's : At baseline                                       General ADL Comments: Pt presents at or near her baseline level of functional independence with ADL management. She denies difficulty with daily tasks and does not present with acute strenght, sensory, visual, perceptual, or cognitive deficits this date. States symptoms have improved greatly from time of initial onset.     Vision Patient Visual Report: No change from baseline Vision Assessment?: No apparent visual deficits     Perception     Praxis      Pertinent Vitals/Pain Pain Assessment Pain Assessment: No/denies pain     Hand Dominance Right  Extremity/Trunk Assessment Upper Extremity Assessment Upper Extremity Assessment: Overall WFL for tasks assessed (No focal weakness appreciated, mild weakness in LUE with MMT however pt remains grossly 5/5 t/o. Notable increased effort with testing on L side. Pt states LUE "feels dead")   Lower Extremity Assessment Lower Extremity Assessment: Defer to PT evaluation;Overall [facility] for tasks assessed   Cervical / Trunk Assessment Cervical / Trunk Assessment: Normal   Communication Communication Communication: No difficulties   Cognition Arousal/Alertness: Awake/alert Behavior During Therapy: WFL for tasks assessed/performed Overall Cognitive Status: Within Functional Limits for tasks assessed                                  General Comments: Pleasant, conversational, follows multi-step VCs consistently. Family at bedside state pt is at baseline.     General Comments       Exercises Other Exercises Other Exercises: Pt/family at bedside educated on DC recs and BE FAST stroke symptom recognition and response. All verbalize understanding of education provided.   Shoulder Instructions      Home Living Family/patient expects to be discharged to:: Private residence Living Arrangements: Children;Parent (With adult children, grand children, and elderly mother) Available Help at Discharge: Family;Available PRN/intermittently Type of Home: Mobile home Home Access: Stairs to enter Entrance Stairs-Number of Steps: 3-4 depending on entrance bilat handrails at back Entrance Stairs-Rails: Right;Left;Can reach both Home Layout: One level     Bathroom Shower/Tub: Teacher, early years/pre: Standard (Has elevated commode if needed.)     Home Equipment: Shower seat          Prior Functioning/Environment Prior Level of Function : Independent/Modified Independent;Driving             Mobility Comments: Denies falls history. ADLs Comments: Independent for ADL/IADL management, cares for mother and grandchildren at baseline. Driving.        OT Problem List: Impaired sensation;Decreased strength      OT Treatment/Interventions:      OT Goals(Current goals can be found in the care plan section) Acute Rehab OT Goals Patient Stated Goal: To go home OT Goal Formulation: All assessment and education complete, DC therapy Time For Goal Achievement: 11/01/22 Potential to Achieve Goals: Good  OT Frequency:      Co-evaluation PT/OT/SLP Co-Evaluation/Treatment: Yes Reason for Co-Treatment: For patient/therapist safety;To address functional/ADL transfers PT goals addressed during session: Mobility/safety with mobility;Balance;Strengthening/ROM OT goals addressed  during session: ADL's and self-care;Strengthening/ROM      AM-PAC OT "6 Clicks" Daily Activity     Outcome Measure Help from another person eating meals?: None Help from another person taking care of personal grooming?: None Help from another person toileting, which includes using toliet, bedpan, or urinal?: None Help from another person bathing (including washing, rinsing, drying)?: None Help from another person to put on and taking off regular upper body clothing?: None Help from another person to put on and taking off regular lower body clothing?: None 6 Click Score: 24   End of Session    Activity Tolerance: Patient tolerated treatment well Patient left: in bed;with family/visitor present;with call bell/phone within reach  OT Visit Diagnosis: Hemiplegia and hemiparesis;Other abnormalities of gait and mobility (R26.89) Hemiplegia - Right/Left: Left Hemiplegia - dominant/non-dominant: Non-Dominant Hemiplegia - caused by: Cerebral infarction (sub-acute)                Time: [id number] OT Time Calculation (min): 16 min Charges:  OT General  Charges $OT Visit: 1 Visit OT Evaluation $OT Eval Low Complexity: 1 Low  Rockney Ghee, M.S., OTR/L 11/01/22, 12:41 PM

## 2022-11-01 NOTE — Consult Note (Signed)
ANTICOAGULATION CONSULT NOTE - Initial Consult  Pharmacy Consult for heparin infusion Indication:  Thrombus within the right ICA  No Known Allergies  Patient Measurements: Height: 5\' 4"  (162.6 cm) Weight: 58.1 kg (128 lb) IBW/kg (Calculated) : 54.7 Heparin Dosing Weight: 58.1 kg  Vital Signs: Temp: 98 F (36.7 C) (11/21 1350) Temp Source: Oral (11/21 1350) BP: 191/106 (11/21 1348) Pulse Rate: 73 (11/21 1327)  Labs: Recent Labs    11/01/22 0829  HGB 15.4*  HCT 45.1  PLT 272  APTT 26  LABPROT 12.7  INR 1.0  CREATININE 0.59    Estimated Creatinine Clearance: 65.4 mL/min (by C-G formula based on SCr of 0.59 mg/dL).   Medical History: Past Medical History:  Diagnosis Date   Alcohol abuse    Depression with anxiety    Dupuytren contracture    HTN (hypertension) with goal to be determined    Tobacco abuse     Medications:  No home anticoagulants per pharmacist review    Assessment: 61 yo female presented to the ED with left arm weakness and heaviness.  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  Patient is awaiting transfer to neuro ICU.  Per MD consult requesting low dose and no bolus.  Baseline Labs: aPTT = 26s, INR = 1.0, hgb = 15.4, plt = 272  Goal of Therapy:  Heparin level 0.3-0.5 units/ml Monitor platelets by anticoagulation protocol: Yes   Plan:  No bolus per MD order Start heparin infusion at 900 units/hr Check anti-Xa level in 6 hours and daily while on heparin Continue to monitor H&H and platelets  48, PharmD 11/01/2022,1:54 PM

## 2022-11-01 NOTE — Progress Notes (Signed)
*  PRELIMINARY RESULTS* Echocardiogram 2D Echocardiogram has been performed.  Cristela Blue 11/01/2022, 2:19 PM

## 2022-11-01 NOTE — H&P (Addendum)
History and Physical    [name] [id number] DOB: 06-May-1961 DOA: 11/01/2022  Referring MD/NP/PA:   PCP: Pcp, No   Patient coming from:  The patient is coming from home.  At baseline, pt is independent for most of ADL.        Chief Complaint: Left arm numbness and heaviness  HPI: [name] is a 61 y.o. female with medical history significant of hypertension, depression with anxiety, alcohol abuse, tobacco abuse, Dupuytren contracture in both hands, who presents with left arm numbness and heaviness.    Patient states that she has right arm numbness and heaviness for several weeks, which seems to have worsened today.  Her leg feels normal.  Patient does not have slurred speech vision loss or difficulty swallowing.  Patient denies chest pain, cough, shortness breath.  No nausea vomiting, diarrhea or abdominal pain.  No symptoms of UTI.   Data reviewed independently and ED Course: pt was found to have WBC 4.7, INR 1.0, PTT 26, negative pregnancy test, negative urinalysis, alcohol level 47, GFR> 60.  Temperature normal, blood pressure 155/87, heart rate 74, RR 18, oxygen saturation 100% on room air.  Patient is placed on telemetry bed for patient, Dr. Quinn Axe of neurology is consulted.  CT head: Atrophy with small vessel chronic ischemic changes of deep cerebral white matter.   Small old cortical infarct high RIGHT parietal region.   Additional likely subacute infarct posterior RIGHT parietal region; this could be better assessed by MR.   No acute intracranial abnormalities.    CTA of head and neck: 1. 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 But the Right ICA and anterior circulation remain patent. No superimposed large vessel occlusion.   2. Positive also for moderate to severe atherosclerotic stenosis at the Left Vertebral Artery origin, with evidence of  asymmetrically decreased flow in the left vertebral compared to the right.   3. Proximal right ICA and bilateral ICA siphon atherosclerosis but no other significant arterial stenosis in the head or neck.   4.  Emphysema (ICD10-J43.9).   EKG: I have personally reviewed.  Sinus rhythm, QTc 382, LAE.   Review of Systems:   General: no fevers, chills, no body weight gain, has fatigue HEENT: no blurry vision, hearing changes or sore throat Respiratory: no dyspnea, coughing, wheezing CV: no chest pain, no palpitations GI: no nausea, vomiting, abdominal pain, diarrhea, constipation GU: no dysuria, burning on urination, increased urinary frequency, hematuria  Ext: no leg edema Neuro:  no vision change or hearing loss.  Has right arm numbness and heaviness. Skin: no rash, no skin tear. MSK: No muscle spasm, no deformity, no limitation of range of movement in spin Heme: No easy bruising.  Travel history: No recent long distant travel.   Allergy: No Known Allergies  Past Medical History:  Diagnosis Date   Alcohol abuse    Depression with anxiety    Dupuytren contracture    HTN (hypertension) with goal to be determined    Tobacco abuse     Past Surgical History:  Procedure Laterality Date   ABDOMINAL HYSTERECTOMY Bilateral 08/12/2010    Social History:  reports that she has been smoking cigarettes. She uses smokeless tobacco. She reports current alcohol use of about 28.0 standard drinks of alcohol per week. She reports that she does not use drugs.  Family History:  Family History  Problem Relation Age of Onset  Healthy Mother    Cancer Father      Prior to Admission medications   Medication Sig Start Date End Date Taking? Authorizing Provider  PARoxetine (PAXIL) 10 MG tablet Take one daily for one week and then increase to 2 daily. 02/14/20   Libby Maw, MD    Physical Exam: Vitals:   11/01/22 1000 11/01/22 1030 11/01/22 1100 11/01/22 1327  BP: (!) 147/90  (!) 170/112 (!) 140/97 (!) 193/91  Pulse: 72 66 72 73  Resp: (!) 21 10 12 20   Temp:      SpO2: 99% 99% 100% 98%  Weight:      Height:       General: Not in acute distress HEENT:       Eyes: PERRL, EOMI, no scleral icterus.       ENT: No discharge from the ears and nose, no pharynx injection, no tonsillar enlargement.        Neck: No JVD, no bruit, no mass felt. Heme: No neck lymph node enlargement. Cardiac: S1/S2, RRR, No murmurs, No gallops or rubs. Respiratory: No rales, wheezing, rhonchi or rubs. GI: Soft, nondistended, nontender, no rebound pain, no organomegaly, BS present. GU: No hematuria Ext: No pitting leg edema bilaterally. 1+DP/PT pulse bilaterally. Musculoskeletal: No joint deformities, No joint redness or warmth, no limitation of ROM in spin. Skin: No rashes.  Neuro: Alert, oriented X3, cranial nerves II-XII grossly intact, moves all extremities normally.  Muscle strength is slightly weaker in left arm. Sensation to light touch intact.  Psych: Patient is not psychotic, no suicidal or hemocidal ideation.  Labs on Admission: I have personally reviewed following labs and imaging studies  CBC: Recent Labs  Lab 11/01/22 0829  WBC 4.7  NEUTROABS 2.0  HGB 15.4*  HCT 45.1  MCV 97.8  PLT [id number]   Basic Metabolic Panel: Recent Labs  Lab 11/01/22 0829  NA 140  K 3.9  CL 109  CO2 24  GLUCOSE 84  BUN 8  CREATININE 0.59  CALCIUM 9.0   GFR: Estimated Creatinine Clearance: 65.4 mL/min (by C-G formula based on SCr of 0.59 mg/dL). Liver Function Tests: Recent Labs  Lab 11/01/22 0829  AST 42*  ALT 30  ALKPHOS 94  BILITOT 0.5  PROT 7.7  ALBUMIN 4.0   No results for input(s): "LIPASE", "AMYLASE" in the last 168 hours. No results for input(s): "AMMONIA" in the last 168 hours. Coagulation Profile: Recent Labs  Lab 11/01/22 0829  INR 1.0   Cardiac Enzymes: No results for input(s): "CKTOTAL", "CKMB", "CKMBINDEX", "TROPONINI" in the last 168 hours. BNP (last 3  results) No results for input(s): "PROBNP" in the last 8760 hours. HbA1C: No results for input(s): "HGBA1C" in the last 72 hours. CBG: No results for input(s): "GLUCAP" in the last 168 hours. Lipid Profile: No results for input(s): "CHOL", "HDL", "LDLCALC", "TRIG", "CHOLHDL", "LDLDIRECT" in the last 72 hours. Thyroid Function Tests: Recent Labs    11/01/22 0829  TSH 2.389  FREET4 0.64   Anemia Panel: No results for input(s): "VITAMINB12", "FOLATE", "FERRITIN", "TIBC", "IRON", "RETICCTPCT" in the last 72 hours. Urine analysis:    Component Value Date/Time   COLORURINE YELLOW (A) 11/01/2022 0852   APPEARANCEUR CLEAR (A) 11/01/2022 0852   LABSPEC 1.014 11/01/2022 0852   PHURINE 5.0 11/01/2022 [place] 11/01/2022 0852   GLUCOSEU NEGATIVE 02/14/2020 1145   HGBUR NEGATIVE 11/01/2022 0852   BILIRUBINUR NEGATIVE 11/01/2022 0852   KETONESUR NEGATIVE 11/01/2022 0852   PROTEINUR NEGATIVE 11/01/2022  5784   UROBILINOGEN 0.2 02/14/2020 1145   NITRITE NEGATIVE 11/01/2022 0852   LEUKOCYTESUR NEGATIVE 11/01/2022 0852   Sepsis Labs: @LABRCNTIP (procalcitonin:4,lacticidven:4) )No results found for this or any previous visit (from the past 240 hour(s)).   Radiological Exams on Admission: MR BRAIN WO CONTRAST  Result Date: 11/01/2022 CLINICAL DATA:  Stroke suspected. EXAM: MRI HEAD WITHOUT CONTRAST TECHNIQUE: Multiplanar, multiecho pulse sequences of the brain and surrounding structures were obtained without intravenous contrast. COMPARISON:  Same-day CT brain and head and neck angiogram. FINDINGS: Brain: There are multiple acute infarcts in the right MCA territory involving the right frontal lobe (series 5, image 25), right parietal lobe (series 5, image 37-34, 28, 25), in the posterior aspect of the right temporal lobe (series 5, image 20). There is an additional small punctate focus of acute infarct in the posteroinferior right occipital c lobe, which is likely in the right PCA  territory (series 5, image 14). There is a chronic infarct in the high right parietal lobe (series 15, image 42). There is sequela of mild chronic microvascular ischemic change. There is a small amount of petechial hemorrhage associated with infarcts in the low right parietal lobe. No large parenchymal hematoma is visualized. Vascular: Normal flow voids. Skull and upper cervical spine: Normal marrow signal. Sinuses/Orbits: Negative. Other: None IMPRESSION: 1. Multiple acute infarcts in the right MCA territory involving the right frontal lobe, right parietal lobe, and posterior aspect of the right temporal lobe. Given findings on same-day CTA head/neck angiogram, these are favored to be embolic. 2. There is an additional small punctate focus of acute infarct in the right PCA territory involving the right occipital lobe. 3. Small amount of petechial hemorrhage associated with infarcts in the low right parietal lobe. No large parenchymal hematoma is visualized. Electronically Signed   By: 11/03/2022 M.D.   On: 11/01/2022 12:54   CT ANGIO HEAD NECK W WO CM  Addendum Date: 11/01/2022   ADDENDUM REPORT: 11/01/2022 09:54 ADDENDUM: #1 was discussed by telephone with Dr. 11/03/2022 on 11/01/2022 at 0937 hours. Electronically Signed   By: 11/03/2022 M.D.   On: 11/01/2022 09:54   Result Date: 11/01/2022 CLINICAL DATA:  61 year old female with left side numbness, tingling, paresthesia. EXAM: CT ANGIOGRAPHY HEAD AND NECK TECHNIQUE: Multidetector CT imaging of the head and neck was performed using the standard protocol during bolus administration of intravenous contrast. Multiplanar CT image reconstructions and MIPs were obtained to evaluate the vascular anatomy. Carotid stenosis measurements (when applicable) are obtained utilizing NASCET criteria, using the distal internal carotid diameter as the denominator. RADIATION DOSE REDUCTION: This exam was performed according to the departmental dose-optimization program which  includes automated exposure control, adjustment of the mA and/or kV according to patient size and/or use of iterative reconstruction technique. CONTRAST:  28mL OMNIPAQUE IOHEXOL 350 MG/ML SOLN COMPARISON:  Plain head CT today 0837 hours. FINDINGS: CTA NECK Skeleton: Cervical spine degeneration with some bulky endplate changes. No acute osseous abnormality identified. Congenital incomplete ossification of the posterior C1 ring. Upper chest: Asymmetric centrilobular emphysema and scarring in the right upper lobe. Negative visible superior mediastinum. Other neck: No acute finding. Aortic arch: 3 vessel arch configuration. Mild arch tortuosity. Minimal arch atherosclerosis. Right carotid system: Mildly tortuous brachiocephalic artery with some soft plaque but no stenosis. Tortuous proximal right CCA. Minimal right CCA plaque. But at the right ICA origin there is fairly bulky soft and atherosclerotic plaque with evidence of adherent thrombus and thrombus located centrally within the right ICA  bulb. See series 4, image 94 and series 10, image 46. Subsequent high-grade stenosis. But the right ICA remains patent to the skull base. Left carotid system: Mildly tortuous left CCA with no significant plaque or stenosis. Soft plaque at the posterior left ICA origin and calcified plaque at the distal right ICA bulb. Less than 50 % stenosis with respect to the distal vessel. Tortuous left ICA below the skull base. Vertebral arteries: Soft plaque at the right subclavian artery origin without stenosis. Normal right vertebral artery origin. Right vertebral artery appears patent and dominant to the skull base without stenosis. Proximal left subclavian artery soft plaque without stenosis. Soft plaque and moderate or severe stenosis at the left vertebral artery origin on series 8, image 118. The left vertebral is non dominant, diminutive but patent to the skull base. There is subtle asymmetric decreased enhancement of the left vertebral  compared to the right. CTA HEAD Posterior circulation: Relatively dominant although normal caliber right vertebral V4 segment is patent to the basilar without stenosis. Patent right PICA origin. Patent but asymmetrically decreased enhancement of the left vertebral V4 segment, which is diminutive beyond the left PICA which remains patent. Small contribution to the vertebrobasilar junction. Patent although diminutive Basilar artery. Patent SCA and left PCA origins. Fetal type right PCA origin. Diminutive or absent left posterior communicating artery. Bilateral PCA branches are patent with no definite stenosis. Anterior circulation: Both ICA siphons are patent with fairly symmetric enhancement. Mild to moderate bilateral siphon calcified plaque with no significant stenosis. Normal right posterior communicating artery origin. Patent carotid termini. Patent MCA and ACA origins. Dominant left and diminutive right ACA A1 segments. Anterior communicating artery is also diminutive. Left ACA A2 somewhat dominant, but bilateral ACA branches appear patent without stenosis. Left MCA M1 segment and trifurcation are patent without stenosis. Left MCA branches are within normal limits. Right MCA M1 segment and bifurcation are patent without stenosis. Right MCA branches appear within normal limits, no convincing branch occlusion. Venous sinuses: Early contrast timing, not well evaluated. Anatomic variants: Fetal type right PCA origin. Dominant appearing right vertebral artery, although may beyond the basis of left vertebral origin stenosis. Dominant left and diminutive right ACA A1 segments. Review of the MIP images confirms the above findings IMPRESSION: 1. 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 But the Right ICA and anterior circulation remain patent. No superimposed large vessel occlusion. 2. Positive also for moderate  to severe atherosclerotic stenosis at the Left Vertebral Artery origin, with evidence of asymmetrically decreased flow in the left vertebral compared to the right. 3. Proximal right ICA and bilateral ICA siphon atherosclerosis but no other significant arterial stenosis in the head or neck. 4.  Emphysema (ICD10-J43.9). Electronically Signed: By: Genevie Ann M.D. On: 11/01/2022 09:35   CT HEAD WO CONTRAST  Result Date: 11/01/2022 CLINICAL DATA:  LEFT side numbness and tingling, paresthesias, LEFT arm heaviness, symptoms for last few weeks EXAM: CT HEAD WITHOUT CONTRAST TECHNIQUE: Contiguous axial images were obtained from the base of the skull through the vertex without intravenous contrast. RADIATION DOSE REDUCTION: This exam was performed according to the departmental dose-optimization program which includes automated exposure control, adjustment of the mA and/or kV according to patient size and/or use of iterative reconstruction technique. COMPARISON:  04/07/2006 FINDINGS: Brain: Generalized atrophy. Normal ventricular morphology. No midline shift or mass effect. Small old cortical infarct high RIGHT parietal region. Additional likely subacute  infarct posterior RIGHT parietal region. Minor small vessel chronic ischemic changes of deep cerebral white matter. No intracranial hemorrhage, mass lesion, or definite acute infarction. No extra-axial fluid collections. Vascular: No hyperdense vessels. Minimal atherosclerotic calcifications of internal carotid arteries at skull base Skull: Intact Sinuses/Orbits: Clear Other: N/A IMPRESSION: Atrophy with small vessel chronic ischemic changes of deep cerebral white matter. Small old cortical infarct high RIGHT parietal region. Additional likely subacute infarct posterior RIGHT parietal region; this could be better assessed by MR. No acute intracranial abnormalities. Electronically Signed   By: Lavonia Dana M.D.   On: 11/01/2022 08:43      Assessment/Plan Principal  Problem:   Stroke [facility]) Active Problems:   HTN (hypertension)   Depression with anxiety   Tobacco abuse   Alcohol abuse   Embolic stroke ([place])   Assessment and Plan:  Stroke [facility]): CT-head showed small old cortical infarct high RIGHT parietal region, possible subacute infarct posterior RIGHT parietal region. CTA showed THROMBUS within the Right ICA origin and bulb, appears adherent to Right ICA origin plaque with subsequent relatively High-grade stenosis. Dr. Quinn Axe of neuro is consulted. Per Dr. Quinn Axe, pt will need to be transferred to [facility] neuro ICU.  -Placed on tele bed for observation - Obtain MRI - Continue Plavix and add ASA per Dr.Stack  --> d/c'ed both since pt will need IV heparin - fasting lipid panel and HbA1c  - 2D transthoracic echocardiography  - swallowing screen. If fails, will get SLP - Check UDS  - PT/OT consult - IV heparin   HTN (hypertension): Patient's not taking medications currently.  Blood pressure 155/87, 140/97 -IV hydralazine as needed for SBP> 180/105 per Dr. Quinn Axe   Depression with anxiety: Not taking medications currently.  No suicidal homicidal ideations. -Observe closely  Tobacco abuse and alcohol abuse:  -Did counseling about importance of quitting tobacco and alcohol use -CIWA protocol -Nicotine patch    DVT ppx: SQ Lovenox --> IV heparin   Code Status: Full code  Family Communication:  Yes, patient's husband at bed side.  Disposition Plan:  Anticipate discharge back to previous environment  Consults called:  Dr. Quinn Axe  Admission status and Level of care: ICU:   for obs    Dispo: The patient is from: Home              Anticipated d/c is to:  to be dermined              Anticipated d/c date is: 1 day              Patient currently is not medically stable to d/c.    Severity of Illness:  The appropriate patient status for this patient is OBSERVATION. Observation status is judged to be reasonable and necessary in  order to provide the required intensity of service to ensure the patient's safety. The patient's presenting symptoms, physical exam findings, and initial radiographic and laboratory data in the context of their medical condition is felt to place them at decreased risk for further clinical deterioration. Furthermore, it is anticipated that the patient will be medically stable for discharge from the hospital within 2 midnights of admission.        Date of Service 11/01/2022    Ivor Costa [facility]   If 7PM-7AM, please contact night-coverage [email or web address] 11/01/2022, 1:38 PM

## 2022-11-01 NOTE — ED Notes (Signed)
MD at bedside for evaluation

## 2022-11-01 NOTE — ED Notes (Signed)
Plan to transfer to [facility] when bed available. In the meantime neuro wants q30 minute neuro checks and VS.  2nd IV will be established, Echo at bedside.

## 2022-11-01 NOTE — ED Triage Notes (Signed)
Pt states left side numbness to the arm and leg. Pt states left arm heaviness. Pt states symptoms for the last several weeks. Pt states no new symptoms in the last 72 hours.  Dr. Fuller Plan assessed in triage, no code stroke activated.

## 2022-11-01 NOTE — ED Provider Notes (Signed)
[facility] Provider Note    Event Date/Time   First MD Initiated Contact with Patient 11/01/22 [phone]     (approximate)   History   Numbness (Left side)   HPI  [name] is a 61 y.o. female   Past medical history of smoker, who presents to the emergency department with numbness to the arm and leg, left arm heaviness for the past several weeks, unchanged.  She has had no new symptoms in the last several days.  She denies trauma and denies other medical complaints.  This morning she awoke after sleeping on the couch with increased left arm numbness and weakness that quickly resolved after waking up and getting off of the couch.  On review of systems she denies fevers, chills, recent illnesses, trauma, headache, vision changes.  She does drink alcohol daily and is a daily smoker as well.  History was obtained via the patient. Her husband is available as an independent historian at bedside who offers collateral information including her past medical history consistent of alcohol use and cigarette smoker.      Physical Exam   Triage Vital Signs: ED Triage Vitals  Enc Vitals Group     BP 11/01/22 0824 (!) 155/87     Pulse Rate 11/01/22 0824 74     Resp 11/01/22 0824 18     Temp 11/01/22 0824 98.3 F (36.8 C)     Temp src --      SpO2 11/01/22 0824 100 %     Weight 11/01/22 0826 128 lb (58.1 kg)     Height 11/01/22 0826 5\' 4"  (1.626 m)     Head Circumference --      Peak Flow --      Pain Score 11/01/22 0826 0     Pain Loc --      Pain Edu? --      Excl. in GC? --     Most recent vital signs: Vitals:   11/01/22 0824  BP: (!) 155/87  Pulse: 74  Resp: 18  Temp: 98.3 F (36.8 C)  SpO2: 100%    General: Awake, no distress.  CV:  Good peripheral perfusion.  Resp:  Normal effort.  Clear lungs Abd:  No distention.  Nontender Other:  Neurologic exam is significant only for numbness to the left arm and leg, no significant motor weakness,  pronator drift, and finger-to-nose is normal.  She is able to ambulate with a slight limp that she says is due to left leg numbness.  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 She does not appear acutely intoxicated nor in acute withdrawal   ED Results / Procedures / Treatments   Labs (all labs ordered are listed, but only abnormal results are displayed) Labs Reviewed  ETHANOL - Abnormal; Notable for the following components:      Result Value   Alcohol, Ethyl (B) 47 (*)    All other components within normal limits  CBC - Abnormal; Notable for the following components:   Hemoglobin 15.4 (*)    All other components within normal limits  COMPREHENSIVE METABOLIC PANEL - Abnormal; Notable for the following components:   AST 42 (*)    All other components within normal limits  URINALYSIS, ROUTINE W REFLEX MICROSCOPIC - Abnormal; Notable for the following components:   Color, Urine YELLOW (*)    APPearance CLEAR (*)  All other components within normal limits  PROTIME-INR  APTT  DIFFERENTIAL  URINE DRUG SCREEN, QUALITATIVE ([facility] ONLY)  VITAMIN B12  TSH  T4, FREE  PREGNANCY, URINE  POC URINE PREG, ED     I reviewed labs and they are notable for alcohol level of 47  EKG  ED ECG REPORT I, Pilar Jarvis, the attending physician, personally viewed and interpreted this ECG.   Date: 11/01/2022  EKG Time: 0827  Rate: 80  Rhythm: normal sinus rhythm, nonspecific ST and T waves changes  Axis: nl  Intervals:none  ST&T Change: Nonspecific T wave inversions aVL, iii    RADIOLOGY I independently reviewed and interpreted CT of the head without contrast and see no obvious bleeding or midline shift, please refer to official radiology report for findings suggestive of infarction.   PROCEDURES:  Critical Care performed: Yes, see critical care procedure note(s)  .Critical Care  Performed by:  Pilar Jarvis, MD Authorized by: Pilar Jarvis, MD   Critical care provider statement:    Critical care time (minutes):  30   Critical care was necessary to treat or prevent imminent or life-threatening deterioration of the following conditions:  CNS failure or compromise (Stroke)   Critical care was time spent personally by me on the following activities:  Development of treatment plan with patient or surrogate, discussions with consultants, evaluation of patient's response to treatment, examination of patient, ordering and review of laboratory studies, ordering and review of radiographic studies, ordering and performing treatments and interventions, pulse oximetry and re-evaluation of patient's condition    MEDICATIONS ORDERED IN ED: Medications  LORazepam (ATIVAN) tablet 1-4 mg (has no administration in time range)    Or  LORazepam (ATIVAN) injection 1-4 mg (has no administration in time range)  thiamine (VITAMIN B1) tablet 100 mg (has no administration in time range)    Or  thiamine (VITAMIN B1) injection 100 mg (has no administration in time range)  folic acid (FOLVITE) tablet 1 mg (has no administration in time range)  multivitamin with minerals tablet 1 tablet (has no administration in time range)  iohexol (OMNIPAQUE) 350 MG/ML injection 75 mL (75 mLs Intravenous Contrast Given 11/01/22 0913)    Consultants:  I spoke with Dr Selina Cooley of neurology regarding care plan for this patient.  IMPRESSION / MDM / ASSESSMENT AND PLAN / ED COURSE  I reviewed the triage vital signs and the nursing notes.                              Differential diagnosis includes, but is not limited to, CVA, intracranial bleeding, electrolyte derangement, hypoglycemia, peripheral neuropathy, MS, ACS, wernicke, b12 deficient   The patient is on the cardiac monitor to evaluate for evidence of arrhythmia and/or significant heart rate changes.  MDM: Patient with numbness and weakness to the left upper and lower  extremities ongoing for the past several weeks, outside of the window for thrombolytics and thrombectomy and symptoms are minor, I suspect that the worsening of the left arm symptoms was due to a peripheral nerve palsy since it quickly resolved and was sustained after sleeping on the couch last night.  We will do a stroke work-up including stroke labs, CT, CTA head and neck and MRI as needed.  Fortunately the patient is stable and symptoms are mild and she has been performing her activities of daily living over the past 2 weeks despite the symptoms.  Will give thiamine and folate,  though I doubt Warnicke's as she does not appear encephalopathic and mild gait disturbance is from numbness sensation. 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  Patient remained stable.  CTA head neck ordered as well as an MRI of the brain.  Dr. Bing Neighbors of neurology was consulted, I spoke with her personally and she will see the patient and start dual antiplatelet, hospitalist paged for admission at this time.   Patient's presentation is most consistent with acute presentation with potential threat to life or bodily function.       FINAL CLINICAL IMPRESSION(S) / ED DIAGNOSES   Final diagnoses:  Cerebrovascular accident (CVA), unspecified mechanism (HCC)     Rx / DC Orders   ED Discharge Orders     None        Note:  This document was prepared using Dragon voice recognition software and may include unintentional dictation errors.    Pilar Jarvis, MD 11/01/22 [phone]

## 2022-11-01 NOTE — Evaluation (Signed)
Physical Therapy Evaluation Patient Details Name: [name] MRN: [id number] DOB: Jul 03, 1961 Today's Date: 11/01/2022  History of Present Illness  Pt admitted to [facility] on 11/01/22 for c/o stroke like symptoms including: L sided weakness and numbness over several weeks. No new symptoms in the last several days. PMH significant for current smoker. Imaging revealed old R parietal infarct and subacute posterior R parietal infarct.   Clinical Impression  Pt is a 61 year old F admitted to hospital on 11/01/22 for stroke like symptoms. At baseline, pt is Ind with ADL's, IADL's, ambulation without AD, and driving.   Pt presents with mild LLE weakness, but otherwise pt with adequate strength, balance, endurance, and safety awareness. She was grossly Ind with bed mobility, transfers, and to walk 226f without AD. No LOB noted with: multidirectional head turns, changes in gait speed, stepping around obstacles, stepping over obstacles, and pivot/turns.   Pt does note that her balance/strength has significantly improved since onset of L sided symptoms several weeks ago. Does still note intermittent L foot drag with fatigue; not present during session. Educated for activity modification/energy conservation for safety at DC. Pt at baseline and therefore is not an appropriate candidate for skilled acute PT services. Recommend DC home with referral to OPPT for strengthening, balance, and endurance.        Recommendations for follow up therapy are one component of a multi-disciplinary discharge planning process, led by the attending physician.  Recommendations may be updated based on patient status, additional functional criteria and insurance authorization.  Follow Up Recommendations Outpatient PT      Assistance Recommended at Discharge PRN     Equipment Recommendations None recommended by PT     Functional Status Assessment Patient has not had a recent decline in their functional status      Precautions / Restrictions Precautions Precautions: Fall Precaution Comments: Low fall      Mobility  Bed Mobility Overal bed mobility: Independent                  Transfers Overall transfer level: Independent                      Ambulation/Gait Ambulation/Gait assistance: Independent Gait Distance (Feet): 200 Feet Assistive device: None         General Gait Details: Supervision for safety but able to be Ind. Demonstrates reciprocal gait with adequate step length/foot clearance bil. No LOB with vertical/horizontal head turns, pivot/turns, stepping over obstacles, changes in gait speed, and stepping around obstacles. States that her gait is near baseline and has improved since initial onset of symptoms (several weeks ago).    Balance Overall balance assessment: Independent                                           Pertinent Vitals/Pain Pain Assessment Pain Assessment: No/denies pain    Home Living Family/patient expects to be discharged to:: Private residence Living Arrangements: Children;Parent (With adult children, grand children, and elderly mother) Available Help at Discharge: Family;Available PRN/intermittently Type of Home: Mobile home Home Access: Stairs to enter Entrance Stairs-Rails: Right;Left;Can reach both Entrance Stairs-Number of Steps: 3-4 depending on entrance bilat handrails at back   Home Layout: One level Home Equipment: Shower seat      Prior Function Prior Level of Function : Independent/Modified Independent;Driving  Mobility Comments: Denies falls history. ADLs Comments: Independent for ADL/IADL management, cares for mother and grandchildren at baseline. Driving.     Hand Dominance   Dominant Hand: Right    Extremity/Trunk Assessment   Upper Extremity Assessment Upper Extremity Assessment: Defer to OT evaluation;Overall [facility] for tasks assessed    Lower Extremity Assessment Lower  Extremity Assessment: Overall WFL for tasks assessed;LLE deficits/detail (RLE: hip flexion 4+/5; otherwise grossly 5/5. Sensation and coordination intact. C/o N/T in L foot/hand (new)) LLE Deficits / Details: mild LLE strength deficits: hip flexion 4/5, knee ext 5/5, knee flexion 4+/5, DF/PF 4+/5. sensation intact. coordination intact.    Cervical / Trunk Assessment Cervical / Trunk Assessment: Normal  Communication   Communication: No difficulties  Cognition Arousal/Alertness: Awake/alert Behavior During Therapy: WFL for tasks assessed/performed Overall Cognitive Status: Within Functional Limits for tasks assessed                                 General Comments: Pleasant, conversational, follows multi-step VCs consistently. Family at bedside state pt is at baseline.           Exercises Other Exercises Other Exercises: Pt/family at bedside educated on: DC recs, energy conservation, and BE FAST stroke symptom recognition and response. All verbalize understanding of education provided.   Assessment/Plan    PT Assessment All further PT needs can be met in the next venue of care  PT Problem List Decreased strength;Decreased activity tolerance           PT Goals (Current goals can be found in the Care Plan section)  Acute Rehab PT Goals Patient Stated Goal: "be Ind" PT Goal Formulation: With patient/family Time For Goal Achievement: 11/15/22 Potential to Achieve Goals: Good         Co-evaluation   Reason for Co-Treatment: For patient/therapist safety;To address functional/ADL transfers PT goals addressed during session: Mobility/safety with mobility;Balance;Strengthening/ROM OT goals addressed during session: ADL's and self-care;Strengthening/ROM       AM-PAC PT "6 Clicks" Mobility  Outcome Measure Help needed turning from your back to your side while in a flat bed without using bedrails?: None Help needed moving from lying on your back to sitting on the  side of a flat bed without using bedrails?: None Help needed moving to and from a bed to a chair (including a wheelchair)?: None Help needed standing up from a chair using your arms (e.g., wheelchair or bedside chair)?: None Help needed to walk in hospital room?: None Help needed climbing 3-5 steps with a railing? : None 6 Click Score: 24    End of Session Equipment Utilized During Treatment: Gait belt Activity Tolerance: Patient tolerated treatment well Patient left: in bed;with family/visitor present Nurse Communication: Mobility status PT Visit Diagnosis: Muscle weakness (generalized) (M62.81)    Time: 7366-8159 PT Time Calculation (min) (ACUTE ONLY): 18 min   Charges:   PT Evaluation $PT Eval Low Complexity: 1 Low          Herminio Commons, PT, DPT 1:02 PM,11/01/22 Physical Therapist - [facility]

## 2022-11-01 NOTE — ED Notes (Signed)
Patient updated on POC - still no bed available at [facility].

## 2022-11-01 NOTE — ED Notes (Signed)
rEPORT TO Efrain Sella, RN

## 2022-11-01 NOTE — Progress Notes (Signed)
SLP Cancellation Note  Patient Details Name: [name] MRN: [id number] DOB: January 27, 1961   Cancelled treatment:       Reason Eval/Treat Not Completed: SLP screened, no needs identified, will sign off   Kanishk Stroebel 11/01/2022, 3:06 PM

## 2022-11-01 NOTE — ED Notes (Signed)
Patient sitting upright and eating in bed. Reports she continues to feel better since being in ER - NIH remains stable. Vitals WNL.   Family at bedside.   No update on bed availability.

## 2022-11-01 NOTE — Consult Note (Signed)
ANTICOAGULATION CONSULT NOTE - Initial Consult  Pharmacy Consult for heparin infusion Indication:  Thrombus within the right ICA  No Known Allergies  Patient Measurements: Height: 5\' 4"  (162.6 cm) Weight: 58.1 kg (128 lb) IBW/kg (Calculated) : 54.7 Heparin Dosing Weight: 58.1 kg  Vital Signs: Temp: 98.3 F (36.8 C) (11/21 2030) Temp Source: Oral (11/21 1350) BP: 138/82 (11/21 2030) Pulse Rate: 88 (11/21 2030)  Labs: Recent Labs    11/01/22 0829 11/01/22 2049  HGB 15.4*  --   HCT 45.1  --   PLT 272  --   APTT 26  --   LABPROT 12.7  --   INR 1.0  --   HEPARINUNFRC  --  0.41  CREATININE 0.59  --      Estimated Creatinine Clearance: 65.4 mL/min (by C-G formula based on SCr of 0.59 mg/dL).   Medical History: Past Medical History:  Diagnosis Date   Alcohol abuse    Depression with anxiety    Dupuytren contracture    HTN (hypertension) with goal to be determined    Tobacco abuse     Medications:  No home anticoagulants per pharmacist review    Assessment: 61 yo female presented to the ED with left arm weakness and heaviness.  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  Patient is awaiting transfer to neuro ICU.  Per MD consult requesting low dose and no bolus.  Baseline Labs: aPTT = 26s, INR = 1.0, hgb = 15.4, plt = 272  11/21 2049 HL = 0.41, Therapeutic x 1   Goal of Therapy:  Heparin level 0.3-0.5 units/ml Monitor platelets by anticoagulation protocol: Yes   Plan:  Heparin therapeutic x 1  Continue heparin infusion at 900 units/hr Check anti-Xa level in 6 hours to confirm Continue to monitor H&H and platelets  2050, PharmD, BCPS Clinical Pharmacist   11/01/2022,9:28 PM

## 2022-11-01 NOTE — ED Notes (Signed)
Called to [facility]/ pt has a bed assignment [address]/rep:Umeeka.

## 2022-11-01 NOTE — ED Notes (Signed)
PT  WAITING  ON  BED  AT  [facility]

## 2022-11-01 NOTE — Consult Note (Signed)
NEUROLOGY CONSULTATION NOTE   Date of service: November 01, 2022 Patient Name: [name] MRN:  [id number] DOB:  1961/01/11 Reason for consult: acute embolic stroke, R ICA thrombus Requesting physician: Dr. Lucillie Garfinkel _ _ _   _ __   _ __ _ _  __ __   _ __   __ _  History of Present Illness   This is a 61 year old woman with past medical history significant for alcohol abuse (currently active), depression, Dupuytren contracture, hypertension, tobacco abuse (current) who presents after an episode of transient left hemiplegia this morning.  Patient states that approximately 3 weeks ago she began to feel weak and numb on her left side.  She did not seek medical attention at the time and has actually not seen a physician in the last 12 years.  She is not sure if the weakness and numbness has gotten any worse over the last 3 weeks but says it might have.  This morning and she had an episode when she woke up where her left arm felt "completely dead." She could not move it or feel it at all. She is not sure if she might have slept wrong but does say that she drinks heavily on a nightly basis. Her acute sx from this AM resolved and she is now back to her recent baseline (that she was at yesterday).  Imaging in ED:  Head CT noncon: acute infarct posterior right parietal region as well as a small old cortical infarct in the high right parietal region  CTA H&N  1. 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 But the Right ICA and anterior circulation remain patent. No superimposed large vessel occlusion.   2. Positive also for moderate to severe atherosclerotic stenosis at the Left Vertebral Artery origin, with evidence of asymmetrically decreased flow in the left vertebral compared to the right.   3. Proximal right ICA and bilateral ICA siphon atherosclerosis but no other significant  arterial stenosis in the head or neck.   4.  Emphysema (ICD10-J43.9).  MRI brain wo contrast  1. Multiple acute infarcts in the right MCA territory involving the right frontal lobe, right parietal lobe, and posterior aspect of the right temporal lobe. Given findings on same-day CTA head/neck angiogram, these are favored to be embolic. 2. There is an additional small punctate focus of acute infarct in the right PCA territory involving the right occipital lobe. 3. Small amount of petechial hemorrhage associated with infarcts in the low right parietal lobe. No large parenchymal hematoma is visualized.  CNS imaging personally reviewed; I agree with above interpretations with the caveat that there is only a small amt petechial hemorrhage present on MRI and the notation that there was no blood visible on head CT      ROS   Per HPI: all other systems reviewed and are negative  Past History   I have reviewed the following:  Past Medical History:  Diagnosis Date   Alcohol abuse    Depression with anxiety    Dupuytren contracture    HTN (hypertension) with goal to be determined    Tobacco abuse    Past Surgical History:  Procedure Laterality Date   ABDOMINAL HYSTERECTOMY Bilateral 08/12/2010   Family History  Problem Relation Age of Onset   Healthy Mother    Cancer Father    Social History   Socioeconomic History   Marital status:  Married    Spouse name: Not on file   Number of children: Not on file   Years of education: Not on file   Highest education level: Not on file  Occupational History   Not on file  Tobacco Use   Smoking status: Every Day    Types: Cigarettes   Smokeless tobacco: Current  Vaping Use   Vaping Use: Never used  Substance and Sexual Activity   Alcohol use: Yes    Alcohol/week: 28.0 standard drinks of alcohol    Types: 28 Cans of beer per week   Drug use: Never   Sexual activity: Not on file  Other Topics Concern   Not on file  Social  History Narrative   Not on file   Social Determinants of Health   Financial Resource Strain: Not on file  Food Insecurity: Not on file  Transportation Needs: Not on file  Physical Activity: Not on file  Stress: Not on file  Social Connections: Not on file   No Known Allergies  Medications   (Not in a hospital admission)     Current Facility-Administered Medications:    [START ON 11/02/2022]  stroke: early stages of recovery book, , Does not apply, Once, Ivor Costa, MD   acetaminophen (TYLENOL) tablet 650 mg, 650 mg, Oral, Q4H PRN **OR** acetaminophen (TYLENOL) 160 MG/5ML solution 650 mg, 650 mg, Per Tube, Q4H PRN **OR** acetaminophen (TYLENOL) suppository 650 mg, 650 mg, Rectal, Q4H PRN, Ivor Costa, MD   aspirin chewable tablet 81 mg, 81 mg, Oral, Daily, Derek Jack, MD, 81 mg at 11/01/22 1109   clopidogrel (PLAVIX) tablet 75 mg, 75 mg, Oral, Daily, Derek Jack, MD, 75 mg at 11/01/22 1109   enoxaparin (LOVENOX) injection 40 mg, 40 mg, Subcutaneous, Q24H, Ivor Costa, MD   folic acid (FOLVITE) tablet 1 mg, 1 mg, Oral, Daily, Lucillie Garfinkel, MD, 1 mg at 11/01/22 1109   hydrALAZINE (APRESOLINE) injection 5 mg, 5 mg, Intravenous, Q2H PRN, Ivor Costa, MD   LORazepam (ATIVAN) tablet 1-4 mg, 1-4 mg, Oral, Q1H PRN **OR** LORazepam (ATIVAN) injection 1-4 mg, 1-4 mg, Intravenous, Q1H PRN, Lucillie Garfinkel, MD   multivitamin with minerals tablet 1 tablet, 1 tablet, Oral, Daily, Lucillie Garfinkel, MD   nicotine (NICODERM CQ - dosed in mg/24 hours) patch 21 mg, 21 mg, Transdermal, Daily, Ivor Costa, MD, 21 mg at 11/01/22 1109   ondansetron (ZOFRAN) injection 4 mg, 4 mg, Intravenous, Q8H PRN, Ivor Costa, MD   senna-docusate (Senokot-S) tablet 1 tablet, 1 tablet, Oral, QHS PRN, Ivor Costa, MD   thiamine (VITAMIN B1) tablet 100 mg, 100 mg, Oral, Daily, 100 mg at 11/01/22 1109 **OR** thiamine (VITAMIN B1) injection 100 mg, 100 mg, Intravenous, Daily, Lucillie Garfinkel, MD  Current Outpatient Medications:     PARoxetine (PAXIL) 10 MG tablet, Take one daily for one week and then increase to 2 daily. (Patient not taking: Reported on 11/01/2022), Disp: 60 tablet, Rfl: 1  Vitals   Vitals:   11/01/22 0930 11/01/22 1000 11/01/22 1030 11/01/22 1100  BP: (!) 170/90 (!) 147/90 (!) 170/112 (!) 140/97  Pulse:  72 66 72  Resp: 17 (!) _0 Temp:      SpO2:  99% 99% 100%  Weight:      Height:         Body mass index is 21.97 kg/m.  Physical Exam   Physical Exam Gen: A&O x4, NAD HEENT: Atraumatic, normocephalic;mucous membranes moist; oropharynx clear, tongue without atrophy or  fasciculations. Neck: Supple, trachea midline. Resp: CTAB, no w/r/r CV: RRR, no m/g/r; nml S1 and S2. 2+ symmetric peripheral pulses. Abd: soft/NT/ND; nabs x 4 quad Extrem: Nml bulk; no cyanosis, clubbing, or edema.  Neuro: *MS: A&O x4. Follows multi-step commands.  *Speech: fluid, nondysarthric, able to name and repeat *CN:    I: Deferred   II,III: PERRLA, VFF by confrontation, optic discs unable to be visualized 2/2 pupillary constriction   III,IV,VI: EOMI w/o nystagmus, no ptosis   V: Sensation intact from V1 to V3 to LT   VII: Eyelid closure was full.  Smile symmetric.   VIII: Hearing intact to voice   IX,X: Voice normal, palate elevates symmetrically    XI: SCM/trap 5/5 bilat   XII: Tongue protrudes midline, no atrophy or fasciculations   *Motor:   Normal bulk.  No tremor, rigidity or bradykinesia. No pronator drift.    Strength: Dlt Bic Tri WrE WrF FgS Gr HF KnF KnE PlF DoF    Left 5 5 4+ _0 4+ _1 Right _2 *Sensory: subjective sensory deficit LUE without objective sensory deficits on exam. No extinction to DSS *Coordination:  Dysmetria on FNF on L, intact on R *Reflexes:  2+ and symmetric throughout without clonus; toes down-going bilat *Gait: deferred  NIHSS  1a Level of Conscious.: 0 1b LOC Questions: 0 1c LOC Commands: 0 2 Best Gaze: 0 3 Visual: 0 4  Facial Palsy: 0 5a Motor Arm - left: 0 5b Motor Arm - Right: 0 6a Motor Leg - Left: 0 6b Motor Leg - Right: 0 7 Limb Ataxia: 0 8 Sensory: 1 9 Best Language: 0 10 Dysarthria: 0 11 Extinct. and Inatten.: 0  TOTAL: 1  Premorbid mRS = 1   Labs   CBC:  Recent Labs  Lab 11/01/22 0829  WBC 4.7  NEUTROABS 2.0  HGB 15.4*  HCT 45.1  MCV 97.8  PLT 876    Basic Metabolic Panel:  Lab Results  Component Value Date   NA 140 11/01/2022   K 3.9 11/01/2022   CO2 24 11/01/2022   GLUCOSE 84 11/01/2022   BUN 8 11/01/2022   CREATININE 0.59 11/01/2022   CALCIUM 9.0 11/01/2022   GFRNONAA >60 11/01/2022   GFRAA  08/18/2009    >60        The eGFR has been calculated using the MDRD equation. This calculation has not been validated in all clinical situations. eGFR's persistently <60 mL/min signify possible Chronic Kidney Disease.   Lipid Panel:  Lab Results  Component Value Date   LDLCALC 142 (H) 02/14/2020   HgbA1c: No results found for: "HGBA1C" Urine Drug Screen:     Component Value Date/Time   LABOPIA NONE DETECTED 11/01/2022 0852   LABOPIA NONE DETECTED 06/17/2007 0110   COCAINSCRNUR NONE DETECTED 11/01/2022 0852   LABBENZ NONE DETECTED 11/01/2022 0852   LABBENZ NONE DETECTED 06/17/2007 0110   AMPHETMU NONE DETECTED 11/01/2022 0852   AMPHETMU NONE DETECTED 06/17/2007 0110   THCU NONE DETECTED 11/01/2022 0852   THCU NONE DETECTED 06/17/2007 0110   LABBARB NONE DETECTED 11/01/2022 0852   LABBARB  06/17/2007 0110    NONE DETECTED        DRUG SCREEN FOR MEDICAL PURPOSES ONLY.  IF CONFIRMATION IS NEEDED FOR ANY PURPOSE, NOTIFY LAB WITHIN 5 DAYS.    Alcohol Level     Component Value  Date/Time   ETH 47 (H) 11/01/2022 1884     Impression   This is a 61 year old woman with past medical history significant for alcohol abuse (currently active), depression, Dupuytren contracture, hypertension, tobacco abuse (current) who presents after an episode of transient left  hemiplegia this morning and L sided weakness and numbness x3 weeks. MRI brain showed multiple small acute and subacute infarcts in the R MCA distribution. There was also a very small amt petechial hemorrhage noted (no visible blood on CT head). CTA H&N showed R ICA thrombus near the origin and bulb with subsequent moderately severe stenosis. The appearance of the diffusion restriction on MRI in the setting of these CTA findings is c/w multiple embolic events from the R ICA to the R MCA distribution over the past 3 weeks. She is at high risk for further embolic events which could potentially be very severe. I discussed these findings with Dr. Ladean Raya who felt that acute intervention on the current lesion would not be helpful and could likely embolize more thrombus to the brain. She did recommend and I am in agreement that patient should be transferred to [facility] neuro ICU for very careful monitoring. If patient does have another embolic event leading to acute occlusion of a large vessel then she would be taken emergently to thrombectomy.   In this situation patient should either be on anticoagulation or DAPT and there is not a clear answer to which is better. She is at high risk of further embolic events and this risk will be lower with anticoagulation than with DAPT. At the same time given her acute strokes she is at higher risk of hemorrhagic conversion with anticoagulation than with DAPT. Given the small size of her strokes I feel that the risk of embolus without AC is higher than the risk of hemorrhagic conversion on it and therefore I recommended to her that we begin anticoagulation with heparin gtt in low therapeutic range without bolus. She is in agreement with this plan. She does have a small amt of petechial hemorrhage on MRI but given the small amt and no acute blood on CT this finding does not change my recommendation.    Recommendations   - Transfer to [facility] neuro ICU under Dr. Roland Rack w/ neurology for close monitoring for anticoagulation, embolic strokes in setting of R ICA thrombus. Bed pending - Heparin gtt low therapeutic window with no bolus - D/c ASA, plavix, lovenox - Permissive HTN goal BP <180/105. PRN labetalol or hydralazine if BP above these parameters. Avoid oral antihypertensives. - OK for diet, patient passed swallow study - TTE w/ bubble - Check A1c and LDL + add statin per guidelines - Vital signs and NIHSS q 30 min. For any change in exam activate STROKE CODE with STAT CT head and CTA H&N - Tele - PT/OT/SLP - Stroke education - CIWA - Thiamine 123m daily - Amb referral to neurology upon discharge - [facility]  D/w Dr. KLeonel Ramsayneurology and Dr. DLadean Rayaneurointervention by phone and Dr. NBlaine Hamperhospitalist and Dr. WJacelyn GripEDP in person.  This patient is critically ill and at significant risk of neurological worsening, death and care requires constant monitoring of vital signs, hemodynamics,respiratory and cardiac monitoring, neurological assessment, discussion with family, other specialists and medical decision making of high complexity. I spent 120 minutes of neurocritical care time  in the care of  this patient. This was time spent independent  of any time provided by nurse practitioner or PA.  Su Monks, MD [facility] [phone]  If 7pm- 7am, please page neurology on call as listed in [place].

## 2022-11-02 ENCOUNTER — Inpatient Hospital Stay (HOSPITAL_COMMUNITY)
Admission: RE | Admit: 2022-11-02 | Discharge: 2022-11-07 | DRG: 064 | Disposition: A | Discharge status: Home / Self Care | Payer: Self-pay | Attending: Neurology | Admitting: Neurology

## 2022-11-02 ENCOUNTER — Encounter (HOSPITAL_COMMUNITY): Payer: Self-pay | Admitting: Neurology

## 2022-11-02 ENCOUNTER — Other Ambulatory Visit (HOSPITAL_COMMUNITY): Payer: Self-pay

## 2022-11-02 DIAGNOSIS — G8334 Monoplegia, unspecified affecting left nondominant side: Secondary | ICD-10-CM | POA: Diagnosis present

## 2022-11-02 DIAGNOSIS — I63411 Cerebral infarction due to embolism of right middle cerebral artery: Principal | ICD-10-CM | POA: Diagnosis present

## 2022-11-02 DIAGNOSIS — I10 Essential (primary) hypertension: Secondary | ICD-10-CM | POA: Diagnosis present

## 2022-11-02 DIAGNOSIS — I63239 Cerebral infarction due to unspecified occlusion or stenosis of unspecified carotid arteries: Secondary | ICD-10-CM

## 2022-11-02 DIAGNOSIS — I629 Nontraumatic intracranial hemorrhage, unspecified: Secondary | ICD-10-CM | POA: Diagnosis present

## 2022-11-02 DIAGNOSIS — F419 Anxiety disorder, unspecified: Secondary | ICD-10-CM | POA: Diagnosis present

## 2022-11-02 DIAGNOSIS — I63511 Cerebral infarction due to unspecified occlusion or stenosis of right middle cerebral artery: Principal | ICD-10-CM | POA: Diagnosis present

## 2022-11-02 DIAGNOSIS — M720 Palmar fascial fibromatosis [Dupuytren]: Secondary | ICD-10-CM | POA: Diagnosis present

## 2022-11-02 DIAGNOSIS — F32A Depression, unspecified: Secondary | ICD-10-CM | POA: Diagnosis present

## 2022-11-02 DIAGNOSIS — E785 Hyperlipidemia, unspecified: Secondary | ICD-10-CM | POA: Diagnosis present

## 2022-11-02 DIAGNOSIS — Z90710 Acquired absence of both cervix and uterus: Secondary | ICD-10-CM

## 2022-11-02 DIAGNOSIS — F17210 Nicotine dependence, cigarettes, uncomplicated: Secondary | ICD-10-CM | POA: Diagnosis present

## 2022-11-02 DIAGNOSIS — F1290 Cannabis use, unspecified, uncomplicated: Secondary | ICD-10-CM | POA: Diagnosis present

## 2022-11-02 DIAGNOSIS — F1010 Alcohol abuse, uncomplicated: Secondary | ICD-10-CM | POA: Diagnosis present

## 2022-11-02 DIAGNOSIS — R29702 NIHSS score 2: Secondary | ICD-10-CM | POA: Diagnosis present

## 2022-11-02 DIAGNOSIS — I63031 Cerebral infarction due to thrombosis of right carotid artery: Secondary | ICD-10-CM | POA: Diagnosis present

## 2022-11-02 DIAGNOSIS — Z597 Insufficient social insurance and welfare support: Secondary | ICD-10-CM

## 2022-11-02 DIAGNOSIS — Z716 Tobacco abuse counseling: Secondary | ICD-10-CM

## 2022-11-02 LAB — COMPREHENSIVE METABOLIC PANEL
ALT: 27 U/L (ref 0–44)
AST: 31 U/L (ref 15–41)
Albumin: 3.4 g/dL — ABNORMAL LOW (ref 3.5–5.0)
Alkaline Phosphatase: 82 U/L (ref 38–126)
Anion gap: 10 (ref 5–15)
BUN: 7 mg/dL (ref 6–20)
CO2: 23 mmol/L (ref 22–32)
Calcium: 9 mg/dL (ref 8.9–10.3)
Chloride: 104 mmol/L (ref 98–111)
Creatinine, Ser: 0.69 mg/dL (ref 0.44–1.00)
GFR, Estimated: >60 mL/min (ref >60)
Glucose, Bld: 98 mg/dL (ref 70–99)
Potassium: 3.3 mmol/L — ABNORMAL LOW (ref 3.5–5.1)
Sodium: 137 mmol/L (ref 135–145)
Total Bilirubin: 1 mg/dL (ref 0.3–1.2)
Total Protein: 6.5 g/dL (ref 6.5–8.1)

## 2022-11-02 LAB — LIPID PANEL
Cholesterol: 218 mg/dL — ABNORMAL HIGH (ref 0–200)
HDL: 67 mg/dL (ref >40)
LDL Cholesterol: 137 mg/dL — ABNORMAL HIGH (ref 0–99)
Total CHOL/HDL Ratio: 3.3 RATIO
Triglycerides: 69 mg/dL (ref <150)
VLDL: 14 mg/dL (ref 0–40)

## 2022-11-02 LAB — MRSA NEXT GEN BY PCR, NASAL: MRSA by PCR Next Gen: NOT DETECTED

## 2022-11-02 LAB — CBC
HCT: 41.4 % (ref 36.0–46.0)
Hemoglobin: 14.3 g/dL (ref 12.0–15.0)
MCH: 33.5 pg (ref 26.0–34.0)
MCHC: 34.5 g/dL (ref 30.0–36.0)
MCV: 97 fL (ref 80.0–100.0)
Platelets: 230 10*3/uL (ref 150–400)
RBC: 4.27 MIL/uL (ref 3.87–5.11)
RDW: 12.6 % (ref 11.5–15.5)
WBC: 7.2 10*3/uL (ref 4.0–10.5)
nRBC: 0 % (ref 0.0–0.2)

## 2022-11-02 LAB — HIV ANTIBODY (ROUTINE TESTING W REFLEX): HIV Screen 4th Generation wRfx: NONREACTIVE

## 2022-11-02 LAB — HEPARIN LEVEL (UNFRACTIONATED)
Heparin Unfractionated: 0.55 IU/mL (ref 0.30–0.70)
Heparin Unfractionated: 0.41 IU/mL (ref 0.30–0.70)

## 2022-11-02 MED ORDER — CHLORHEXIDINE GLUCONATE CLOTH 2 % EX PADS
6.0000 | MEDICATED_PAD | Freq: Every day | CUTANEOUS | Status: DC
  Administered 2022-11-02 – 2022-11-07 (×6): 6 via TOPICAL

## 2022-11-02 MED ORDER — ADULT MULTIVITAMIN W/MINERALS CH
1.0000 | ORAL_TABLET | Freq: Every day | ORAL | Status: DC
  Administered 2022-11-02 – 2022-11-07 (×6): 1 via ORAL
  Filled 2022-11-02 (×6): qty 1

## 2022-11-02 MED ORDER — NICOTINE 21 MG/24HR TD PT24
21.0000 mg | MEDICATED_PATCH | Freq: Every day | TRANSDERMAL | Status: DC
  Administered 2022-11-02 – 2022-11-07 (×7): 21 mg via TRANSDERMAL
  Filled 2022-11-02 (×7): qty 1

## 2022-11-02 MED ORDER — STROKE: EARLY STAGES OF RECOVERY BOOK
Freq: Once | Status: AC
  Filled 2022-11-02: qty 1

## 2022-11-02 MED ORDER — LORAZEPAM 1 MG PO TABS
0.0000 mg | ORAL_TABLET | Freq: Four times a day (QID) | ORAL | Status: AC
  Administered 2022-11-03: 1 mg via ORAL
  Administered 2022-11-04: 2 mg via ORAL
  Filled 2022-11-02: qty 1
  Filled 2022-11-02: qty 2

## 2022-11-02 MED ORDER — LORAZEPAM 2 MG/ML IJ SOLN: 1.0000 mg | INTRAMUSCULAR | Status: DC | PRN

## 2022-11-02 MED ORDER — SENNOSIDES-DOCUSATE SODIUM 8.6-50 MG PO TABS: 1.0000 | ORAL_TABLET | Freq: Every evening | ORAL | Status: DC | PRN

## 2022-11-02 MED ORDER — POTASSIUM CHLORIDE 20 MEQ PO PACK
40.0000 meq | PACK | Freq: Once | ORAL | Status: AC
  Administered 2022-11-02: 40 meq via ORAL
  Filled 2022-11-02: qty 2

## 2022-11-02 MED ORDER — LABETALOL HCL 5 MG/ML IV SOLN: 10.0000 mg | Freq: Once | INTRAVENOUS | Status: AC

## 2022-11-02 MED ORDER — SODIUM CHLORIDE 0.9 % IV SOLN: INTRAVENOUS | Status: DC

## 2022-11-02 MED ORDER — THIAMINE MONONITRATE 100 MG PO TABS
100.0000 mg | ORAL_TABLET | Freq: Every day | ORAL | Status: DC
  Administered 2022-11-02 – 2022-11-07 (×6): 100 mg via ORAL
  Filled 2022-11-02 (×6): qty 1

## 2022-11-02 MED ORDER — ACETAMINOPHEN 160 MG/5ML PO SOLN: 650.0000 mg | ORAL | Status: DC | PRN

## 2022-11-02 MED ORDER — FOLIC ACID 1 MG PO TABS
1.0000 mg | ORAL_TABLET | Freq: Every day | ORAL | Status: DC
  Administered 2022-11-02 – 2022-11-07 (×6): 1 mg via ORAL
  Filled 2022-11-02 (×6): qty 1

## 2022-11-02 MED ORDER — HEPARIN (PORCINE) 25000 UT/250ML-% IV SOLN
800.0000 [IU]/h | INTRAVENOUS | Status: DC
  Administered 2022-11-02 – 2022-11-05 (×3): 850 [IU]/h via INTRAVENOUS
  Filled 2022-11-02 (×3): qty 250

## 2022-11-02 MED ORDER — LORAZEPAM 1 MG PO TABS
0.0000 mg | ORAL_TABLET | Freq: Two times a day (BID) | ORAL | Status: AC
  Administered 2022-11-04 – 2022-11-06 (×2): 1 mg via ORAL
  Filled 2022-11-02 (×2): qty 1

## 2022-11-02 MED ORDER — ACETAMINOPHEN 325 MG PO TABS
650.0000 mg | ORAL_TABLET | ORAL | Status: DC | PRN
  Filled 2022-11-02: qty 2

## 2022-11-02 MED ORDER — POTASSIUM CHLORIDE 10 MEQ/100ML IV SOLN
10.0000 meq | INTRAVENOUS | Status: DC
  Filled 2022-11-02: qty 100

## 2022-11-02 MED ORDER — LABETALOL HCL 5 MG/ML IV SOLN
INTRAVENOUS | Status: AC
  Administered 2022-11-02: 10 mg via INTRAVENOUS
  Filled 2022-11-02: qty 4

## 2022-11-02 MED ORDER — ACETAMINOPHEN 650 MG RE SUPP: 650.0000 mg | RECTAL | Status: DC | PRN

## 2022-11-02 MED ORDER — ATORVASTATIN CALCIUM 80 MG PO TABS
80.0000 mg | ORAL_TABLET | Freq: Every day | ORAL | Status: DC
  Administered 2022-11-02 – 2022-11-07 (×6): 80 mg via ORAL
  Filled 2022-11-02 (×6): qty 1

## 2022-11-02 NOTE — Evaluation (Signed)
Physical Therapy Evaluation Patient Details Name: [name] MRN: [id number] DOB: 1961-03-07 Today's Date: 11/02/2022  History of Present Illness  61 yo female [facility]/22 for L sided weakness and numbness over several weeks. MRI (+) scattered R hemispheric CVA, R parietal region, CTA (+) R ICA thrombus PMH current smoker. Pt transferred to [facility] hospital overnight 11/21. 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  Clinical Impression  Pt presents to PT with mild deficits in L sided strength and with chronic deficits in RLE strength. Pt is able to ambulate independently at this time, tolerating multiple dynamic gait and balance challenges well. Pt reports continued improvement in symptoms, even since transfer from [facility]. Pt has no further acute PT needs at this time. Acute PT signing off.       Recommendations for follow up therapy are one component of a multi-disciplinary discharge planning process, led by the attending physician.  Recommendations may be updated based on patient status, additional functional criteria and insurance authorization.  Follow Up Recommendations No PT follow up      Assistance Recommended at Discharge PRN  Patient can return home with the following       Equipment Recommendations None recommended by PT  Recommendations for Other Services       Functional Status Assessment Patient has not had a recent decline in their functional status     Precautions / Restrictions Precautions Precautions: Fall Precaution Comments: low fall, chronic R ankle weakness Restrictions Weight Bearing Restrictions: No      Mobility  Bed Mobility Overal bed mobility: Independent                  Transfers Overall transfer level: Independent                      Ambulation/Gait Ambulation/Gait assistance: Independent Gait Distance (Feet):  400 Feet Assistive device: None Gait Pattern/deviations: WFL(Within Functional Limits) Gait velocity: functional Gait velocity interpretation: >2.62 ft/sec, indicative of community ambulatory   General Gait Details: steady step-through gait, able to change speed, stop abruptly, walk backward, change stride length all without LOB  Stairs Stairs: Yes Stairs assistance: Modified independent (Device/Increase time) Stair Management: One rail Left, Alternating pattern Number of Stairs: 4    Wheelchair Mobility    Modified Rankin (Stroke Patients Only)       Balance Overall balance assessment: Independent               Single Leg Stance - Right Leg: 5 Single Leg Stance - Left Leg: 5 Tandem Stance - Right Leg: 20   Rhomberg - Eyes Opened: 30 Rhomberg - Eyes Closed: 30                 Pertinent Vitals/Pain Pain Assessment Pain Assessment: No/denies pain    Home Living Family/patient expects to be discharged to:: Private residence Living Arrangements: Spouse/significant other;Other (Comment);Parent Available Help at Discharge: Family;Available PRN/intermittently Type of Home: Mobile home Home Access: Stairs to enter Entrance Stairs-Rails: Right;Left;Can reach both Entrance Stairs-Number of Steps: 3-4 depending on entrance bilat handrails at back   Home Layout: One level Home Equipment: Shower seat;Hand held shower head Additional Comments: grandchildren 23 and 28 yo. . she quit job to stay home with grandchildren. spouse is working. mom has dementia.    Prior Function Prior Level of Function : Independent/Modified Independent;Driving  Mobility Comments: multiple recent near falls over past few months ADLs Comments: Independent for ADL/IADL management, cares for mother and grandchildren at baseline. Driving.     Hand Dominance   Dominant Hand: Right    Extremity/Trunk Assessment   Upper Extremity Assessment Upper Extremity Assessment:  Defer to OT evaluation    Lower Extremity Assessment Lower Extremity Assessment: LLE deficits/detail;RLE deficits/detail RLE Deficits / Details: pt with chronic R ankle DF weakness, 4+/5 today LLE Deficits / Details: grossly 4+/5    Cervical / Trunk Assessment Cervical / Trunk Assessment: Normal  Communication   Communication: No difficulties  Cognition Arousal/Alertness: Awake/alert Behavior During Therapy: WFL for tasks assessed/performed Overall Cognitive Status: Within Functional Limits for tasks assessed                                          General Comments General comments (skin integrity, edema, etc.): VSS on RA    Exercises     Assessment/Plan    PT Assessment Patient does not need any further PT services  PT Problem List         PT Treatment Interventions      PT Goals (Current goals can be found in the Care Plan section)       Frequency       Co-evaluation               AM-PAC PT "6 Clicks" Mobility  Outcome Measure Help needed turning from your back to your side while in a flat bed without using bedrails?: None Help needed moving from lying on your back to sitting on the side of a flat bed without using bedrails?: None Help needed moving to and from a bed to a chair (including a wheelchair)?: None Help needed standing up from a chair using your arms (e.g., wheelchair or bedside chair)?: None Help needed to walk in hospital room?: None Help needed climbing 3-5 steps with a railing? : None 6 Click Score: 24    End of Session   Activity Tolerance: Patient tolerated treatment well Patient left: in bed;with call bell/phone within reach Nurse Communication: Mobility status PT Visit Diagnosis: Other symptoms and signs involving the nervous system (R29.898)    Time: 9509-3267 PT Time Calculation (min) (ACUTE ONLY): 16 min   Charges:   PT Evaluation $PT Eval Low Complexity: 1 Low          Arlyss Gandy, PT, DPT Acute  Rehabilitation Office [phone]   Arlyss Gandy 11/02/2022, 3:09 PM

## 2022-11-02 NOTE — Progress Notes (Signed)
MD notified of progressively elevated BP, now 200s/100s up from SBP 140s-160s overnight. Permissive hypertension up to 220/110, will start BP medication later today.

## 2022-11-02 NOTE — Progress Notes (Signed)
  Transition of Care [facility]) Screening Note   Patient Details  Name: [name] Date of Birth: 1961/08/25   [facility] CM/SW Contact:    Glennon Mac, RN Phone Number: 11/02/2022, 1:28 PM    Transition of Care Department [facility]) has reviewed patient and no TOC needs have been identified at this time. We will continue to monitor patient advancement through interdisciplinary progression rounds. If new patient transition needs arise, please place a TOC consult.  Quintella Baton, RN, BSN  Trauma/Neuro ICU Case Manager [phone]

## 2022-11-02 NOTE — Progress Notes (Signed)
Occupational Therapy Treatment Patient Details Name: [name] MRN: [id number] DOB: 02-22-1961 Today's Date: 11/02/2022   History of present illness 61 yo female admitted to [facility] on 11/01/22 for L sided weakness and numbness over several weeks. MRI (+) scattered R hemispheric CVA, R parietal region, CTA (+) R ICA thrombus PMH current smoker. Pt transferred to [facility] hospital overnight 11/21. 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   OT comments  No splint present at this time. OT spoke with family and pt regarding the purpose of the splint. RN aware of no splint at this time so OT to follow up once delivered to room. Pt educated on splint wear schedule night time only. Need to stretch and massage the hand. Recommendation for HHOT.    Recommendations for follow up therapy are one component of a multi-disciplinary discharge planning process, led by the attending physician.  Recommendations may be updated based on patient status, additional functional criteria and insurance authorization.    Follow Up Recommendations  Outpatient OT     Assistance Recommended at Discharge PRN  Patient can return home with the following  Assist for transportation   Equipment Recommendations  None recommended by OT    Recommendations for Other Services Rehab consult    Precautions / Restrictions Precautions Precautions: Fall Precaution Comments: low fall, chronic R ankle weakness Restrictions Weight Bearing Restrictions: No       Mobility Bed Mobility Overal bed mobility: Independent                  Transfers Overall transfer level: Independent                       Balance Overall balance assessment: Independent                                         ADL either performed or assessed with clinical judgement   ADL Overall ADL's : At baseline                                             Extremity/Trunk Assessment Upper Extremity Assessment Upper Extremity Assessment: LUE deficits/detail LUE Deficits / Details: educated on extension, massage and wearing splint at night. Educated on stretching the hand. Pt return demo. Spouse present for session.   Lower Extremity Assessment Lower Extremity Assessment: LLE deficits/detail;RLE deficits/detail RLE Deficits / Details: pt with chronic R ankle DF weakness, 4+/5 today LLE Deficits / Details: grossly 4+/5   Cervical / Trunk Assessment Cervical / Trunk Assessment: Normal    Vision Baseline Vision/History: 1 Wears glasses Patient Visual Report: No change from baseline Additional Comments: wears glasses for reading only.   Perception     Praxis      Cognition Arousal/Alertness: Awake/alert Behavior During Therapy: WFL for tasks assessed/performed Overall Cognitive Status: Within Functional Limits for tasks assessed                                          Exercises Other Exercises Other Exercises: educated on signs and symptoms of a stroke. Pt educated on cease smoking  an salt intake. Pt verbalized eating salt on all her foods. pt states she eats salt on her strawberry toast. Pt advised to talk to MD regarding labs and current risk factors. pt states mother was told by doctor to let her eat salt because she lacked a nutrient in her body if she needed to eat it that much.    Shoulder Instructions       General Comments VSS on RA.    Pertinent Vitals/ Pain       Pain Assessment Pain Assessment: No/denies pain  Home Living Family/patient expects to be discharged to:: Private residence Living Arrangements: Spouse/significant other;Other (Comment);Parent Available Help at Discharge: Family;Available PRN/intermittently Type of Home: Mobile home Home Access: Stairs to enter Entrance Stairs-Number of Steps: 3-4 depending on entrance bilat handrails at  back Entrance Stairs-Rails: Right;Left;Can reach both Home Layout: One level     Bathroom Shower/Tub: Chief Strategy Officer: Standard     Home Equipment: Shower seat;Hand held shower head   Additional Comments: grandchildren 6 and 56 yo. . she quit job to stay home with grandchildren. spouse is working. mom has dementia.      Prior Functioning/Environment              Frequency  Min 2X/week        Progress Toward Goals  OT Goals(current goals can now be found in the care plan section)  Progress towards OT goals: Progressing toward goals  Acute Rehab OT Goals Patient Stated Goal: to go home OT Goal Formulation: With patient Time For Goal Achievement: 11/16/22 Potential to Achieve Goals: Good  Plan      Co-evaluation                 AM-PAC OT "6 Clicks" Daily Activity     Outcome Measure   Help from another person eating meals?: None Help from another person taking care of personal grooming?: None Help from another person toileting, which includes using toliet, bedpan, or urinal?: None Help from another person bathing (including washing, rinsing, drying)?: None Help from another person to put on and taking off regular upper body clothing?: None Help from another person to put on and taking off regular lower body clothing?: None 6 Click Score: 24    End of Session    OT Visit Diagnosis: Hemiplegia and hemiparesis;Other abnormalities of gait and mobility (R26.89) Hemiplegia - Right/Left: Left Hemiplegia - dominant/non-dominant: Non-Dominant Hemiplegia - caused by: Cerebral infarction   Activity Tolerance Patient tolerated treatment well   Patient Left in bed;with call bell/phone within reach;with bed alarm set;with family/visitor present   Nurse Communication Mobility status        Time: 1535-1600 OT Time Calculation (min): 25 min  Charges: OT General Charges $OT Visit: 1 Visit OT Evaluation $OT Eval Moderate Complexity: 1  Mod OT Treatments $Therapeutic Exercise: 23-37 mins   Brynn, OTR/L  Acute Rehabilitation Services Office: [phone] .   Mateo Flow 11/02/2022, 4:34 PM

## 2022-11-02 NOTE — Consult Note (Addendum)
ANTICOAGULATION CONSULT NOTE  Pharmacy Consult for heparin infusion Indication:  Thrombus within the right ICA  No Known Allergies  Patient Measurements: Height: 5\' 4"  (162.6 cm) Weight: 59.2 kg (130 lb 8.2 oz) IBW/kg (Calculated) : 54.7 Heparin Dosing Weight: 59.2 kg  Vital Signs: Temp: 97.8 F (36.6 C) (11/22 0800) Temp Source: Oral (11/22 0800) BP: 157/139 (11/22 1000) Pulse Rate: 76 (11/22 1000)  Labs: Recent Labs    11/01/22 0829 11/01/22 2049 11/02/22 0529  HGB 15.4*  --  14.3  HCT 45.1  --  41.4  PLT 272  --  230  APTT 26  --   --   LABPROT 12.7  --   --   INR 1.0  --   --   HEPARINUNFRC  --  0.41 0.55  CREATININE 0.59  --  0.69     Estimated Creatinine Clearance: 65.4 mL/min (by C-G formula based on SCr of 0.69 mg/dL).   Medical History: Past Medical History:  Diagnosis Date   Alcohol abuse    Depression with anxiety    Dupuytren contracture    HTN (hypertension) with goal to be determined    Tobacco abuse     Assessment: 61 yo female presented to the ED with left arm weakness and heaviness. 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 Pharmacy consulted to dose heparin per stroke protocol. Patient is not on anticoagulation PTA.  Heparin level slightly supratherapeutic for lower goal at 0.55 today. CBC wnl. No bleeding or issues with infusion per discussion with RN.  Goal of Therapy:  Heparin level 0.3-0.5 units/ml Monitor platelets by anticoagulation protocol: Yes   Plan:  Reduce heparin infusion to 850 units/hr Check 6hr heparin level Monitor daily CBC, s/sx bleeding   46, PharmD, BCPS Please check AMION for all [facility] Pharmacy contact numbers Clinical Pharmacist 11/02/2022 11:06 AM

## 2022-11-02 NOTE — Progress Notes (Signed)
SLP Cancellation Note  Patient Details Name: SCHERRY LAVERNE MRN: [id number] DOB: 05/04/1961   Cancelled treatment:       Reason Eval/Treat Not Completed: SLP screened, no needs identified, will sign off. Please reconsult if needs arise.  Emilene Roma B. Murvin Natal, [facility], CCC-SLP Speech Language Pathologist Office: [phone]  Leigh Aurora 11/02/2022, 11:12 AM

## 2022-11-02 NOTE — ED Notes (Signed)
Pt transferred to [facility] at this time. Report given to [facility]. Pt VSS on transport. Care handoff given to carelink RN

## 2022-11-02 NOTE — Progress Notes (Signed)
Orthopedic Tech Progress Note Patient Details:  [name] Feb 07, 1961 [id number]  Called in order to [employer] for a RESTING HAND SPLINT  Patient ID: [name], female   DOB: [birth year], 61 y.o.   MRN: [id number]  Donald Pore 11/02/2022, 4:12 PM

## 2022-11-02 NOTE — Progress Notes (Signed)
Occupational Therapy Evaluation Patient Details Name: [name] MRN: [id number] DOB: 05/21/1961 Today's Date: 11/02/2022   History of Present Illness 61 yo female admitted to [facility] on 11/01/22 for L sided weakness and numbness over several weeks. MRI (+) scattered R hemispheric CVA, R parietal region, CTA (+) R ICA thrombus PMH current smoker. Pt transferred to [facility] hospital overnight 11/21. 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   Clinical Impression   PT admitted with CVA scattered R hemisheric. Pt currently with functional limitiations due to the deficits listed below (see OT problem list). Pt currently with some L hands weakness and noted baseline contractures. Recommendation for resting hand to extend hand at night. Request for splint placed by RN per OT request.  Pt will benefit from skilled OT to increase their independence and safety with adls and balance to allow discharge outpatient. Pt does not have insurance and verbalized not seeking medical care due to concerns over medical bills. Pt could benefit from talking to [facility] financial assistance counselor.        Recommendations for follow up therapy are one component of a multi-disciplinary discharge planning process, led by the attending physician.  Recommendations may be updated based on patient status, additional functional criteria and insurance authorization.   Follow Up Recommendations  Outpatient OT     Assistance Recommended at Discharge PRN  Patient can return home with the following Assist for transportation    Functional Status Assessment  Patient has had a recent decline in their functional status and demonstrates the ability to make significant improvements in function in a reasonable and predictable amount of time.  Equipment Recommendations  None recommended by OT    Recommendations for Other Services        Precautions / Restrictions Precautions Precautions: Fall Precaution Comments: low fall, chronic R ankle weakness Restrictions Weight Bearing Restrictions: No      Mobility Bed Mobility Overal bed mobility: Independent                  Transfers Overall transfer level: Independent                        Balance Overall balance assessment: Independent                                         ADL either performed or assessed with clinical judgement   ADL Overall ADL's : At baseline                                             Vision Baseline Vision/History: 1 Wears glasses Patient Visual Report: No change from baseline Additional Comments: wears glasses for reading only.     Perception     Praxis      Pertinent Vitals/Pain Pain Assessment Pain Assessment: No/denies pain     Hand Dominance Right   Extremity/Trunk Assessment Upper Extremity Assessment Upper Extremity Assessment: LUE deficits/detail LUE Deficits / Details: dupuytren contracture baseline with some weakness in finger extension noted. Pt now with flexion at digits 2- 5th with 4th and 5th digits most contractured. Pt could benefit from resting hand splint for night time only use   Lower  Extremity Assessment Lower Extremity Assessment: LLE deficits/detail;RLE deficits/detail RLE Deficits / Details: pt with chronic R ankle DF weakness, 4+/5 today LLE Deficits / Details: grossly 4+/5   Cervical / Trunk Assessment Cervical / Trunk Assessment: Normal   Communication Communication Communication: No difficulties   Cognition Arousal/Alertness: Awake/alert Behavior During Therapy: WFL for tasks assessed/performed Overall Cognitive Status: Within Functional Limits for tasks assessed                                       General Comments  VSS on RA.    Exercises     Shoulder Instructions      Home Living Family/patient expects to  be discharged to:: Private residence Living Arrangements: Spouse/significant other;Other (Comment);Parent Available Help at Discharge: Family;Available PRN/intermittently Type of Home: Mobile home Home Access: Stairs to enter Entrance Stairs-Number of Steps: 3-4 depending on entrance bilat handrails at back Entrance Stairs-Rails: Right;Left;Can reach both Home Layout: One level     Bathroom Shower/Tub: Chief Strategy Officer: Standard     Home Equipment: Shower seat;Hand held shower head   Additional Comments: grandchildren 6 and 76 yo. . she quit job to stay home with grandchildren. spouse is working. mom has dementia.      Prior Functioning/Environment Prior Level of Function : Independent/Modified Independent;Driving             Mobility Comments: multiple recent near falls over past few months ADLs Comments: Independent for ADL/IADL management, cares for mother and grandchildren at baseline. Driving.        OT Problem List: Impaired sensation;Decreased strength      OT Treatment/Interventions: Self-care/ADL training;Therapeutic exercise;DME and/or AE instruction;Therapeutic activities;Patient/family education;Balance training;Modalities;Manual therapy    OT Goals(Current goals can be found in the care plan section) Acute Rehab OT Goals Patient Stated Goal: to go home OT Goal Formulation: With patient Time For Goal Achievement: 11/16/22 Potential to Achieve Goals: Good  OT Frequency: Min 2X/week    Co-evaluation              AM-PAC OT "6 Clicks" Daily Activity     Outcome Measure Help from another person eating meals?: None Help from another person taking care of personal grooming?: None Help from another person toileting, which includes using toliet, bedpan, or urinal?: None Help from another person bathing (including washing, rinsing, drying)?: None Help from another person to put on and taking off regular upper body clothing?: None Help from  another person to put on and taking off regular lower body clothing?: None 6 Click Score: 24   End of Session Nurse Communication: Mobility status  Activity Tolerance: Patient tolerated treatment well Patient left: in bed;with call bell/phone within reach;with bed alarm set;with family/visitor present  OT Visit Diagnosis: Hemiplegia and hemiparesis;Other abnormalities of gait and mobility (R26.89) Hemiplegia - Right/Left: Left Hemiplegia - dominant/non-dominant: Non-Dominant Hemiplegia - caused by: Cerebral infarction                Time: 9937-1696 OT Time Calculation (min): 18 min Charges:  OT General Charges $OT Visit: 1 Visit OT Evaluation $OT Eval Moderate Complexity: 1 Mod   Brynn, OTR/L  Acute Rehabilitation Services Office: [phone] .   Mateo Flow 11/02/2022, 4:22 PM

## 2022-11-02 NOTE — H&P (Signed)
NEUROLOGY CONSULTATION NOTE   Date of service: November 02, 2022 Patient Name: [name] MRN:  [id number] DOB:  1961/01/28  _ _ _   _ __   _ __ _ _  __ __   _ __   __ _  History of Present Illness  [name] is a 61 y.o. female with PMH significant for Alcohol abuse with 6-8 12Oz beers a night, Depression with anxiety, Dupuytren contracture, HTN, smoker 2-3 packs a day who presents with left sided weakness.  Reports about 5 months ago, left foot went numb and weak. She was able to regain most the strength over a couple months. About 2-3 weeks ago, left foot and hand went numb and weak but improved within an hour. Has been waxing and waning since then. On 11/01/22, she got up in the morning and was getting her grand kids ready for the school when her L arm and L hand was completely paralyzed and dead and was unable to move it at all. This did not get better so she went to the ED where she was improved again.  Workup with CTH notable for strokes in posterior R parietal region, CTA with R ICA thrombus at the origin and bulb. MRI Brain with scattered R hemispheric strokes. She was started on heparin gtt and transferred to [facility] for close observation. Case was discussed with Neuro IR and agreed with potential thrombectomy in case of worsening deficit.  LKW: atleast a couple weeks ago. mRS: 0 tNKASE: not offered, outside window Thrombectomy: not offered at this time given low NIHSS and potential risk of thrombectomy outweighs benefit. However, if the noted thrombus was to embolize distally and result in disabling deficit, will need to take her for emergent thrombectomy. Patient is aware of this and does endorse that this was discussed with her at [facility] and she has no further questions at this time. NIHSS components Score: Comment  1a Level of Conscious 0[x] 1[] 2[] 3[]     1b LOC Questions 0[x] 1[] 2[]      1c LOC Commands 0[x] 1[] 2[]      2 Best Gaze 0[x] 1[] 2[]      3 Visual 0[x]  1[] 2[] 3[]     4 Facial Palsy 0[x] 1[] 2[] 3[]     5a Motor Arm - left 0[] 1[x] 2[] 3[] 4[] UN[]   5b Motor Arm - Right 0[x] 1[] 2[] 3[] 4[] UN[]   6a Motor Leg - Left 0[x] 1[] 2[] 3[] 4[] UN[]   6b Motor Leg - Right 0[x] 1[] 2[] 3[] 4[] UN[]   7 Limb Ataxia 0[x] 1[] 2[] 3[] UN[]    8 Sensory 0[] 1[x] 2[] UN[]     9 Best Language 0[x] 1[] 2[] 3[]     10 Dysarthria 0[x] 1[] 2[] UN[]     11 Extinct. and Inattention 0[x] 1[] 2[]      TOTAL:       ROS   Constitutional Denies weight loss, fever and chills.   HEENT Denies changes in vision and hearing.   Respiratory Denies SOB and cough.   CV Denies palpitations and CP   GI Denies abdominal pain, nausea, vomiting and diarrhea.   GU Denies dysuria and urinary frequency.   MSK Denies myalgia and joint pain.   Skin Denies rash and pruritus.   Neurological Denies headache and syncope.   Psychiatric Denies recent changes in mood. Denies anxiety and depression.  Past History   Past Medical History:  Diagnosis Date   Alcohol abuse    Depression with anxiety    Dupuytren contracture    HTN (hypertension) with goal to be determined    Tobacco abuse    Past Surgical History:  Procedure Laterality Date   ABDOMINAL HYSTERECTOMY Bilateral 08/12/2010   Family History  Problem Relation Age of Onset   Healthy Mother    Cancer Father    Social History   Socioeconomic History   Marital status: Married    Spouse name: Not on file   Number of children: Not on file   Years of education: Not on file   Highest education level: Not on file  Occupational History   Not on file  Tobacco Use   Smoking status: Every Day    Types: Cigarettes   Smokeless tobacco: Current  Vaping Use   Vaping Use: Never used  Substance and Sexual Activity   Alcohol use: Yes    Alcohol/week: 28.0 standard drinks of alcohol    Types: 28 Cans of beer per week   Drug use: Never   Sexual activity: Not on file  Other Topics Concern   Not on file  Social  History Narrative   Not on file   Social Determinants of Health   Financial Resource Strain: Not on file  Food Insecurity: Not on file  Transportation Needs: Not on file  Physical Activity: Not on file  Stress: Not on file  Social Connections: Not on file   No Known Allergies  Medications   Medications Prior to Admission  Medication Sig Dispense Refill Last Dose   PARoxetine (PAXIL) 10 MG tablet Take one daily for one week and then increase to 2 daily. (Patient not taking: Reported on 11/01/2022) 60 tablet 1      Vitals   Vitals:   11/02/22 0037 11/02/22 0100  BP: (!) 219/137 (!) 166/88  Pulse: 84 69  Resp: 17 15  Temp: 97.9 F (36.6 C)   TempSrc: Oral   SpO2: 100% 97%  Weight: 59.2 kg      Body mass index is 22.4 kg/m.  Physical Exam   General: Laying comfortably in bed; in no acute distress. HENT: Normal oropharynx and mucosa. Normal external appearance of ears and nose.  Neck: Supple, no pain or tenderness  CV: No JVD. No peripheral edema.  Pulmonary: Symmetric Chest rise. Normal respiratory effort.  Abdomen: Soft to touch, non-tender.  Ext: No cyanosis, edema, or deformity  Skin: No rash. Normal palpation of skin.  Musculoskeletal: Normal digits and nails by inspection. No clubbing.   Neurologic Examination  Mental status/Cognition: Alert, oriented to self, place, month and year, good attention.  Speech/language: Fluent, comprehension intact, object naming intact, repetition intact.  Cranial nerves:   CN II Pupils equal and reactive to light, no VF deficits    CN III,IV,VI EOM intact, no gaze preference or deviation, no nystagmus    CN V normal sensation in V1, V2, and V3 segments bilaterally    CN VII no asymmetry, no nasolabial fold flattening   CN VIII normal hearing to speech    CN IX & X normal palatal elevation, no uvular deviation    CN XI 5/5 head turn and 5/5 shoulder shrug bilaterally    CN XII midline tongue protrusion    Motor:  Muscle  bulk: normal, tone normal, pronator drift noted in LUE Mvmt Root Nerve  Muscle Right Left Comments  SA C5/6 Ax Deltoid 5   4+   EF C5/6 Mc Biceps 5 4+   EE C6/7/8 Rad Triceps 5 4+   WF C6/7 Med FCR     WE C7/8 PIN ECU     F Ab C8/T1 U ADM/FDI 5 4 Has dupytrene contracture on the left that is chronic.  HF L1/2/3 Fem Illopsoas 5 5   KE L2/3/4 Fem Quad 5 5   DF L4/5 D Peron Tib Ant 5 5   PF S1/2 Tibial Grc/Sol 5 5    Sensation:  Light touch Decreased in LUE and LLE to touch   Pin prick    Temperature    Vibration   Proprioception    Coordination/Complex Motor:  - Finger to Nose intact BL - Heel to shin intact BL - Rapid alternating movement are slowed on the left - Gait: Deferred.  Labs   CBC:  Recent Labs  Lab 11/01/22 0829  WBC 4.7  NEUTROABS 2.0  HGB 15.4*  HCT 45.1  MCV 97.8  PLT 272    Basic Metabolic Panel:  Lab Results  Component Value Date   NA 140 11/01/2022   K 3.9 11/01/2022   CO2 24 11/01/2022   GLUCOSE 84 11/01/2022   BUN 8 11/01/2022   CREATININE 0.59 11/01/2022   CALCIUM 9.0 11/01/2022   GFRNONAA >60 11/01/2022   GFRAA  08/18/2009    >60        The eGFR has been calculated using the MDRD equation. This calculation has not been validated in all clinical situations. eGFR's persistently <60 mL/min signify possible Chronic Kidney Disease.   Lipid Panel:  Lab Results  Component Value Date   LDLCALC 142 (H) 02/14/2020   HgbA1c:  Lab Results  Component Value Date   HGBA1C 5.0 11/01/2022   Urine Drug Screen:     Component Value Date/Time   LABOPIA NONE DETECTED 11/01/2022 0852   LABOPIA NONE DETECTED 06/17/2007 0110   COCAINSCRNUR NONE DETECTED 11/01/2022 0852   LABBENZ NONE DETECTED 11/01/2022 0852   LABBENZ NONE DETECTED 06/17/2007 0110   AMPHETMU NONE DETECTED 11/01/2022 0852   AMPHETMU NONE DETECTED 06/17/2007 0110   THCU NONE DETECTED 11/01/2022 0852   THCU NONE DETECTED 06/17/2007 0110   LABBARB NONE DETECTED 11/01/2022 0852    LABBARB  06/17/2007 0110    NONE DETECTED        DRUG SCREEN FOR MEDICAL PURPOSES ONLY.  IF CONFIRMATION IS NEEDED FOR ANY PURPOSE, NOTIFY LAB WITHIN 5 DAYS.    Alcohol Level     Component Value Date/Time   ETH 47 (H) 11/01/2022 0829    CT Head without contrast(Personally reviewed): Atrophy with small vessel chronic ischemic changes of deep cerebral white matter. Small old cortical infarct high RIGHT parietal region. Additional likely subacute infarct posterior RIGHT parietal region; this could be better assessed by MR. No acute intracranial abnormalities.  CT angio Head and Neck with contrast(Personally reviewed): 1. 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 But the Right ICA and anterior circulation remain patent. No superimposed large vessel occlusion.   2. Positive also for moderate to severe atherosclerotic stenosis at the Left Vertebral Artery origin, with evidence of asymmetrically decreased flow in the left vertebral compared to the right.   3. Proximal right ICA and bilateral ICA siphon atherosclerosis but no other significant arterial stenosis in the head or neck.   4.  Emphysema (ICD10-J43.9).  MRI Brain(Personally reviewed): 1. Multiple acute   infarcts in the right MCA territory involving the right frontal lobe, right parietal lobe, and posterior aspect of the right temporal lobe. Given findings on same-day CTA head/neck angiogram, these are favored to be embolic. 2. There is an additional small punctate focus of acute infarct in the right PCA territory involving the right occipital lobe. 3. Small amount of petechial hemorrhage associated with infarcts in the low right parietal lobe. No large parenchymal hematoma is visualized.  Impression   MATSUKO KRETZ is a 61 y.o. female with PMH significant for Alcohol abuse with 6-8 12Oz beers a night,  Depression with anxiety, Dupuytren contracture, HTN, smoker 2-3 packs a day who presents with left sided weakness + numbness x 3 weeks. MRI with scattered R hemispheric infarcts with small petechial hemorrhage. CTA with R ICA thrombus at the bifurcation and extending into the lumen. Suspected etiology of her multiple R hemispheric strokes is likely distal emboli.  Case was discussed by Dr. Livia Snellen with Dr. Alonza Smoker and felt acute  intervention on the current lesion would not be helpful and could likely embolize more thrombus to the brain. Patient was transferred to [facility] for close monitoring in the Neuro ICU. If she has worsening deficit and concern an embolic event leading to a LVO, will take her for emergent thrombectomy.  She is on neuro scale heparin gtt to lower her risk of embolic event. Recommendations   R hemispheric and R MCA stroke in the setting of R ICA thrombus: - Frequent Neuro checks in Neuro ICU. - Pharmacy consult for Heparin gtt with goal heparin levels between 0.3-0.5. - LDL pending. - Start statin if LDL > 70 - Recommend HbA1c - not on DVT ppx given she is on AC. - SBP goal - permissive hypertension with goal SBP < 220/110.  - Recommend Telemetry monitoring for arrythmia - NPO in case she embolizes and requires intervention. - Stroke education booklet - Recommend PT/OT/SLP consult   EtOH use: - CIWA every 4 hours  Smoker: - counseled on the importance of quitting smoking to reduce risk of strokes. - nicotine patch PRN.  HTN: - goal SBP as above. Hold home meds.  This patient is critically ill and at significant risk of neurological worsening, death and care requires constant monitoring of vital signs, hemodynamics,respiratory and cardiac monitoring, neurological assessment, discussion with family, other specialists and medical decision making of high complexity. I spent 40 minutes of neurocritical care time  in the care of  this patient. This was time  spent independent of any time provided by nurse practitioner or PA.  Donnetta Simpers [facility] Pager Number [phone] 11/02/2022  3:08 AM ______________________________________________________________________   Thank you for the opportunity to take part in the care of this patient. If you have any further questions, please contact the neurology consultation attending.  Signed,  [place] Pager Number [phone] _ _ _   _ __   _ __ _ _  __ __   _ __   __ _

## 2022-11-02 NOTE — Progress Notes (Signed)
STROKE TEAM PROGRESS NOTE   INTERVAL HISTORY He is doing well.  Reports interval improvement in her left hand weakness and numbness.  Asking when she can go home.  Endorses feeling restless, wants to move around.  Vitals:   11/02/22 0700 11/02/22 0800 11/02/22 0900 11/02/22 1000  BP: (!) 178/92 (!) 166/87 (!) 90/56 (!) 157/139  Pulse: 83 71 71 76  Resp: 15 13 17 18   Temp:  97.8 F (36.6 C)    TempSrc:  Oral    SpO2: 94% 97% 99% 100%  Weight:      Height:       CBC:  Recent Labs  Lab 11/01/22 0829 11/02/22 0529  WBC 4.7 7.2  NEUTROABS 2.0  --   HGB 15.4* 14.3  HCT 45.1 41.4  MCV 97.8 97.0  PLT 272 230   Basic Metabolic Panel:  Recent Labs  Lab 11/01/22 0829 11/02/22 0529  NA 140 137  K 3.9 3.3*  CL 109 104  CO2 24 23  GLUCOSE 84 98  BUN 8 7  CREATININE 0.59 0.69  CALCIUM 9.0 9.0   Lipid Panel:  Recent Labs  Lab 11/02/22 0529  CHOL 218*  TRIG 69  HDL 67  CHOLHDL 3.3  VLDL 14  LDLCALC 11/04/22*   HgbA1c:  Recent Labs  Lab 11/01/22 1337  HGBA1C 5.0   Urine Drug Screen:  Recent Labs  Lab 11/01/22 0852  LABOPIA NONE DETECTED  COCAINSCRNUR NONE DETECTED  LABBENZ NONE DETECTED  AMPHETMU NONE DETECTED  THCU NONE DETECTED  LABBARB NONE DETECTED    Alcohol Level  Recent Labs  Lab 11/01/22 0829  ETH 47*    IMAGING past 24 hours ECHOCARDIOGRAM COMPLETE BUBBLE STUDY  Result Date: 11/01/2022    ECHOCARDIOGRAM REPORT   Patient Name:   [name] Date of Exam: 11/01/2022 Medical Rec #:  11/03/2022      Height:       64.0 in Accession #:    [id number]     Weight:       128.0 lb Date of Birth:  03-07-1961      BSA:          1.618 m Patient Age:    61 years       BP:           140/97 mmHg Patient Gender: F              HR:           72 bpm. Exam Location:  [facility] Procedure: 2D Echo, Cardiac Doppler, Color Doppler and Saline Contrast Bubble            Study Indications:     Stroke 434.91 / I63.9  History:         Patient has no prior history of Echocardiogram  examinations.                  Risk Factors:Hypertension. Alcohol and tobacco abuse.  Sonographer:     09/13/1961 Referring Phys:  Cristela Blue [id number] STACK Diagnosing Phys: Malachi Carl MD  Sonographer Comments: Technically challenging study due to limited acoustic windows, suboptimal apical window and suboptimal parasternal window. IMPRESSIONS  1. Left ventricular ejection fraction, by estimation, is 55 to 60%. The left ventricle has normal function. The left ventricle has no regional wall motion abnormalities. Left ventricular diastolic parameters were normal.  2. Right ventricular systolic function is normal. The right ventricular size is normal.  3. The mitral valve is  normal in structure. Trivial mitral valve regurgitation.  4. The aortic valve is normal in structure. Aortic valve regurgitation is not visualized.  5. Agitated saline contrast bubble study was negative, with no evidence of any interatrial shunt. FINDINGS  Left Ventricle: Left ventricular ejection fraction, by estimation, is 55 to 60%. The left ventricle has normal function. The left ventricle has no regional wall motion abnormalities. The left ventricular internal cavity size was normal in size. There is  no left ventricular hypertrophy. Left ventricular diastolic parameters were normal. Right Ventricle: The right ventricular size is normal. No increase in right ventricular wall thickness. Right ventricular systolic function is normal. Left Atrium: Left atrial size was normal in size. Right Atrium: Right atrial size was normal in size. Pericardium: There is no evidence of pericardial effusion. Mitral Valve: The mitral valve is normal in structure. Trivial mitral valve regurgitation. Tricuspid Valve: The tricuspid valve is normal in structure. Tricuspid valve regurgitation is trivial. Aortic Valve: The aortic valve is normal in structure. Aortic valve regurgitation is not visualized. Aortic valve mean gradient measures 3.0 mmHg. Aortic valve peak  gradient measures 5.5 mmHg. Aortic valve area, by VTI measures 2.62 cm. Pulmonic Valve: The pulmonic valve was normal in structure. Pulmonic valve regurgitation is trivial. Aorta: The aortic root and ascending aorta are structurally normal, with no evidence of dilitation. IAS/Shunts: No atrial level shunt detected by color flow Doppler. Agitated saline contrast was given intravenously to evaluate for intracardiac shunting. Agitated saline contrast bubble study was negative, with no evidence of any interatrial shunt.  LEFT VENTRICLE PLAX 2D LVIDd:         3.70 cm   Diastology LVIDs:         2.70 cm   LV e' medial:    5.66 cm/s LV PW:         1.00 cm   LV E/e' medial:  11.8 LV IVS:        0.80 cm   LV e' lateral:   7.29 cm/s LVOT diam:     2.00 cm   LV E/e' lateral: 9.1 LV SV:         57 LV SV Index:   35 LVOT Area:     3.14 cm  RIGHT VENTRICLE RV Basal diam:  2.70 cm RV Mid diam:    2.20 cm RV S prime:     12.80 cm/s TAPSE (M-mode): 1.9 cm LEFT ATRIUM             Index        RIGHT ATRIUM          Index LA diam:        2.70 cm 1.67 cm/m   RA Area:     7.63 cm LA Vol (A2C):   24.7 ml 15.26 ml/m  RA Volume:   16.20 ml 10.01 ml/m LA Vol (A4C):   38.6 ml 23.85 ml/m LA Biplane Vol: 31.7 ml 19.59 ml/m  AORTIC VALVE AV Area (Vmax):    2.31 cm AV Area (Vmean):   2.22 cm AV Area (VTI):     2.62 cm AV Vmax:           117.00 cm/s AV Vmean:          79.200 cm/s AV VTI:            0.217 m AV Peak Grad:      5.5 mmHg AV Mean Grad:      3.0 mmHg LVOT Vmax:  86.20 cm/s LVOT Vmean:        56.000 cm/s LVOT VTI:          0.181 m LVOT/AV VTI ratio: 0.83  AORTA Ao Root diam: 2.87 cm MITRAL VALVE                TRICUSPID VALVE MV Area (PHT): 3.39 cm     TR Peak grad:   17.6 mmHg MV Decel Time: 224 msec     TR Vmax:        210.00 cm/s MV E velocity: 66.60 cm/s MV A velocity: 101.00 cm/s  SHUNTS MV E/A ratio:  0.66         Systemic VTI:  0.18 m                             Systemic Diam: 2.00 cm Brenda Royals MD  Electronically signed by Brenda Royals MD Signature Date/Time: 11/01/2022/4:54:18 PM    Final    MR BRAIN WO CONTRAST  Result Date: 11/01/2022 CLINICAL DATA:  Stroke suspected. EXAM: MRI HEAD WITHOUT CONTRAST TECHNIQUE: Multiplanar, multiecho pulse sequences of the brain and surrounding structures were obtained without intravenous contrast. COMPARISON:  Same-day CT brain and head and neck angiogram. FINDINGS: Brain: There are multiple acute infarcts in the right MCA territory involving the right frontal lobe (series 5, image 25), right parietal lobe (series 5, image 37-34, 28, 25), in the posterior aspect of the right temporal lobe (series 5, image 20). There is an additional small punctate focus of acute infarct in the posteroinferior right occipital c lobe, which is likely in the right PCA territory (series 5, image 14). There is a chronic infarct in the high right parietal lobe (series 15, image 42). There is sequela of mild chronic microvascular ischemic change. There is a small amount of petechial hemorrhage associated with infarcts in the low right parietal lobe. No large parenchymal hematoma is visualized. Vascular: Normal flow voids. Skull and upper cervical spine: Normal marrow signal. Sinuses/Orbits: Negative. Other: None IMPRESSION: 1. Multiple acute infarcts in the right MCA territory involving the right frontal lobe, right parietal lobe, and posterior aspect of the right temporal lobe. Given findings on same-day CTA head/neck angiogram, these are favored to be embolic. 2. There is an additional small punctate focus of acute infarct in the right PCA territory involving the right occipital lobe. 3. Small amount of petechial hemorrhage associated with infarcts in the low right parietal lobe. No large parenchymal hematoma is visualized. Electronically Signed   By: Marin Roberts M.D.   On: 11/01/2022 12:54    PHYSICAL EXAM General: NAD Neuro: A&O x 4.  Good attention and is interactive on  exam. Cranial nerves grossly intact. 5/5 strength in all extremities with exception of 4/5 strength in the left hand   ASSESSMENT/PLAN Ms. ARSIE PARADOWSKI is a 61 y.o. female with history of alcohol abuse, current 2-3 pk/day smoking history, HTN, depression/GAD, dupuytren contracture presenting with left hand weakness and numbness for 3 weeks and found to have scattered R hemispheric infarcts with minimal petechial hemorrhage and R ICA bifurcation thrombus.   Stroke: Scattered right hemispheric infarcts Right ICA bifurcation thrombus Etiology: Likely embolic secondary to ICA thrombus Acute infarcts likely secondary to embolic shower from ICA bifurcation thrombus.  She was placed on heparin drip and managed conservatively.  Neuro exams have been stable so far.  If she acutely worsens on exam, plan is to take her for  thrombectomy at that time. Code Stroke CT: Generalized atrophy with small vessel disease.  Small right parietal remote infarct, right parietal subacute infarct.  No hydrocephalus, bleed. CTA head & neck: Right ICA origin thrombus.  No LVO.  Atherosclerotic stenosis of left vertebral artery origin.  Proximal right ICA and bilateral ICA siphon atherosclerosis. MRI: Multiple acute right MCA territory infarcts involving right frontal, parietal, posterior right temporal lobe.  Small punctate focus of acute infarct in right PCA territory involving right occipital lobe.  Small petechial hemorrhage in low right parietal lobe associated with infarcts.  No large parenchymal hematoma, no SAH. 2D Echo done yesterday showing no valvular abnormalities, no heart failure, no PFO or other interatrial shunt. LDL 137 HgbA1c 5.0 VTE prophylaxis -none, she is currently on heparin drip    Diet   Diet Heart Room service appropriate? Yes; Fluid consistency: Thin  No antithrombotic prior to admission, now on heparin IV for ICA bifurcation thrombus. Therapy recommendations: Pending Disposition:  Pending  Hypertension Home meds: None Permissive hypertension (OK if < 220/120) but gradually normalize in 5-7 days Long-term BP goal normotensive, will likely start antihypertensive regimen in the next couple days.  Hyperlipidemia LDL 137, goal < 70 Added Lipitor 80  EtOH use Tobacco use Patient drinks 6-8 beers a day at home.  Discussed importance of cutting down. Her last drink was approximately 48 hours ago. CIWA scores have been unremarkable and he she has not needed Ativan. She currently feels restless but does not have any other symptoms of withdrawal.  Currently using nicotine patches. -work on alcohol cessation outpatient, can try naltrexone in this patient who does not seem to have advanced liver disease - Currently using nicotine patches.  Recommended smoking cessation.  Can consider Chantix outpatient if she wants to quit smoking.    Hospital day # 0   To contact Stroke Continuity provider, please refer to [email or web address] After hours, contact General Neurology

## 2022-11-02 NOTE — Consult Note (Signed)
ANTICOAGULATION CONSULT NOTE  Pharmacy Consult for heparin infusion Indication:  Thrombus within the right ICA  No Known Allergies  Patient Measurements: Height: 5\' 4"  (162.6 cm) Weight: 59.2 kg (130 lb 8.2 oz) IBW/kg (Calculated) : 54.7 Heparin Dosing Weight: 59.2 kg  Vital Signs: Temp: 98.1 F (36.7 C) (11/22 1600) Temp Source: Oral (11/22 1600) BP: 177/93 (11/22 1900) Pulse Rate: 67 (11/22 1100)  Labs: Recent Labs    11/01/22 0829 11/01/22 2049 11/02/22 0529 11/02/22 1914  HGB 15.4*  --  14.3  --   HCT 45.1  --  41.4  --   PLT 272  --  230  --   APTT 26  --   --   --   LABPROT 12.7  --   --   --   INR 1.0  --   --   --   HEPARINUNFRC  --  0.41 0.55 0.41  CREATININE 0.59  --  0.69  --      Estimated Creatinine Clearance: 65.4 mL/min (by C-G formula based on SCr of 0.69 mg/dL).   Medical History: Past Medical History:  Diagnosis Date   Alcohol abuse    Depression with anxiety    Dupuytren contracture    HTN (hypertension) with goal to be determined    Tobacco abuse     Assessment: 61 yo female presented to the ED with left arm weakness and heaviness. 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 Pharmacy consulted to dose heparin per stroke protocol. Patient is not on anticoagulation PTA.  Heparin level this evening is at goal at 0.41.  No overt bleeding or complications noted.  Goal of Therapy:  Heparin level 0.3-0.5 units/ml Monitor platelets by anticoagulation protocol: Yes   Plan:  Continue IV heparin infusion at 850 units/hr Monitor daily heparin level, CBC, s/sx bleeding  46, Reece Leader, [facility] Clinical Pharmacist  11/02/2022 7:43 PM   [facility] pharmacy phone numbers are listed on [email or web address]

## 2022-11-03 LAB — CBC
HCT: 43.1 % (ref 36.0–46.0)
Hemoglobin: 14.6 g/dL (ref 12.0–15.0)
MCH: 33.6 pg (ref 26.0–34.0)
MCHC: 33.9 g/dL (ref 30.0–36.0)
MCV: 99.3 fL (ref 80.0–100.0)
Platelets: 245 10*3/uL (ref 150–400)
RBC: 4.34 MIL/uL (ref 3.87–5.11)
RDW: 12.6 % (ref 11.5–15.5)
WBC: 7.1 10*3/uL (ref 4.0–10.5)
nRBC: 0 % (ref 0.0–0.2)

## 2022-11-03 LAB — HEPARIN LEVEL (UNFRACTIONATED): Heparin Unfractionated: 0.35 IU/mL (ref 0.30–0.70)

## 2022-11-03 LAB — BASIC METABOLIC PANEL
Anion gap: 13 (ref 5–15)
BUN: 5 mg/dL — ABNORMAL LOW (ref 6–20)
CO2: 23 mmol/L (ref 22–32)
Calcium: 9.5 mg/dL (ref 8.9–10.3)
Chloride: 102 mmol/L (ref 98–111)
Creatinine, Ser: 0.77 mg/dL (ref 0.44–1.00)
GFR, Estimated: >60 mL/min (ref >60)
Glucose, Bld: 108 mg/dL — ABNORMAL HIGH (ref 70–99)
Potassium: 4 mmol/L (ref 3.5–5.1)
Sodium: 138 mmol/L (ref 135–145)

## 2022-11-03 LAB — MAGNESIUM: Magnesium: 2.2 mg/dL (ref 1.7–2.4)

## 2022-11-03 LAB — PHOSPHORUS: Phosphorus: 4 mg/dL (ref 2.5–4.6)

## 2022-11-03 MED ORDER — AMLODIPINE BESYLATE 10 MG PO TABS
10.0000 mg | ORAL_TABLET | Freq: Every day | ORAL | Status: DC
  Administered 2022-11-03 – 2022-11-07 (×5): 10 mg via ORAL
  Filled 2022-11-03 (×5): qty 1

## 2022-11-03 NOTE — Progress Notes (Signed)
ANTICOAGULATION CONSULT NOTE - Follow Up Consult  Pharmacy Consult for Heparin Indication: Thrombus within the right ICA  No Known Allergies  Patient Measurements: Height: 5\' 4"  (162.6 cm) Weight: 59.2 kg (130 lb 8.2 oz) IBW/kg (Calculated) : 54.7 Heparin Dosing Weight: 59.2 kg  Vital Signs: Temp: 98.3 F (36.8 C) (11/23 0336) Temp Source: Oral (11/23 0336) BP: 152/78 (11/23 0336) Pulse Rate: 89 (11/23 0336)  Labs: Recent Labs    11/01/22 0829 11/01/22 2049 11/02/22 0529 11/02/22 1914 11/03/22 0242  HGB 15.4*  --  14.3  --  14.6  HCT 45.1  --  41.4  --  43.1  PLT 272  --  230  --  245  APTT 26  --   --   --   --   LABPROT 12.7  --   --   --   --   INR 1.0  --   --   --   --   HEPARINUNFRC  --    < > 0.55 0.41 0.35  CREATININE 0.59  --  0.69  --  0.77   < > = values in this interval not displayed.    Estimated Creatinine Clearance: 65.4 mL/min (by C-G formula based on SCr of 0.77 mg/dL).   Medications:  Scheduled:    stroke: early stages of recovery book   Does not apply Once   atorvastatin  80 mg Oral Daily   Chlorhexidine Gluconate Cloth  6 each Topical Daily   folic acid  1 mg Oral Daily   LORazepam  0-4 mg Oral Q6H   Followed by   11/05/22 ON 11/04/2022] LORazepam  0-4 mg Oral Q12H   multivitamin with minerals  1 tablet Oral Daily   nicotine  21 mg Transdermal Daily   thiamine  100 mg Oral Daily   Infusions:   heparin 850 Units/hr (11/02/22 2000)    Assessment: 61 yo F presented to the ED with left arm weakness and heaviness. 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 Pharmacy consulted to dose heparin per stroke protocol. Patient is not on anticoagulation PTA.   Heparin level today is therapeutic at 0.35, on 850 units/hr. Hgb 14.6, plt 245 - stable. No line issues or signs/symptoms of bleeding noted per RN.  Goal of Therapy:  Heparin level 0.3-0.5 units/ml Monitor platelets by anticoagulation protocol: Yes   Plan:  Continue IV heparin at 850  units/hr. Daily CBC, heparin level. Monitor for signs/symptoms of bleeding.   46, PharmD PGY-2 Pharmacy Resident Phone [phone] 11/03/2022 8:28 AM   Please check AMION for all [facility] Pharmacy phone numbers After 10:00 PM, call Main Pharmacy [phone]

## 2022-11-03 NOTE — Progress Notes (Addendum)
STROKE TEAM PROGRESS NOTE   INTERVAL HISTORY -no PT f\u, HHOT with splint  -CIWAs unremarkable Patient is feeling very restless and wants to go home.  She feels like nicotine cravings played a big role in this.  Discussed with her the importance of staying to receive IV heparin.  Vitals:   11/02/22 1700 11/02/22 1800 11/02/22 1900 11/02/22 2000  BP: (!) 152/102 (!) 132/93 (!) 177/93 (!) 168/117  Pulse:    91  Resp: 18 15 13 14   Temp:    98.3 F (36.8 C)  TempSrc:    Oral  SpO2:    97%  Weight:      Height:       CBC:  Recent Labs  Lab 11/01/22 0829 11/02/22 0529 11/03/22 0242  WBC 4.7 7.2 7.1  NEUTROABS 2.0  --   --   HGB 15.4* 14.3 14.6  HCT 45.1 41.4 43.1  MCV 97.8 97.0 99.3  PLT 272 230 245    Basic Metabolic Panel:  Recent Labs  Lab 11/02/22 0529 11/03/22 0242  NA 137 138  K 3.3* 4.0  CL 104 102  CO2 23 23  GLUCOSE 98 108*  BUN 7 5*  CREATININE 0.69 0.77  CALCIUM 9.0 9.5  MG  --  2.2  PHOS  --  4.0    Lipid Panel:  Recent Labs  Lab 11/02/22 0529  CHOL 218*  TRIG 69  HDL 67  CHOLHDL 3.3  VLDL 14  LDLCALC 11/04/22*    HgbA1c:  Recent Labs  Lab 11/01/22 1337  HGBA1C 5.0    Urine Drug Screen:  Recent Labs  Lab 11/01/22 0852  LABOPIA NONE DETECTED  COCAINSCRNUR NONE DETECTED  LABBENZ NONE DETECTED  AMPHETMU NONE DETECTED  THCU NONE DETECTED  LABBARB NONE DETECTED     Alcohol Level  Recent Labs  Lab 11/01/22 0829  ETH 47*     IMAGING past 24 hours No results found.  PHYSICAL EXAM General: NAD Neuro: A&O x 4.  Good attention and is interactive on exam. Cranial nerves grossly intact. 5/5 strength in all extremities with exception of 4/5 strength in the left hand   ASSESSMENT/PLAN [name] is a 61 y.o. female with history of alcohol abuse, current 2-3 pk/day smoking history, HTN, depression/GAD, dupuytren contracture presenting with left hand weakness and numbness for 3 weeks and found to have scattered R hemispheric  infarcts with minimal petechial hemorrhage and R ICA bifurcation thrombus.   Stroke: Scattered right hemispheric infarcts Right ICA bifurcation thrombus Etiology: Likely embolic secondary to ICA thrombus Acute infarcts likely secondary to embolic shower from ICA bifurcation thrombus.  She was placed on heparin drip and managed conservatively.  Neuro exams have been stable so far.  Hopefully will stay for least 1 more day to continue receiving heparin, ideally 2 more days. If she acutely worsens on exam, plan is to take her for thrombectomy at that time. Code Stroke CT: Generalized atrophy with small vessel disease.  Small right parietal remote infarct, right parietal subacute infarct.  No hydrocephalus, bleed. CTA head & neck: Right ICA origin thrombus.  No LVO.  Atherosclerotic stenosis of left vertebral artery origin.  Proximal right ICA and bilateral ICA siphon atherosclerosis. MRI: Multiple acute right MCA territory infarcts involving right frontal, parietal, posterior right temporal lobe.  Small punctate focus of acute infarct in right PCA territory involving right occipital lobe.  Small petechial hemorrhage in low right parietal lobe associated with infarcts.  No large parenchymal hematoma,  no SAH. 2D Echo done yesterday showing no valvular abnormalities, no heart failure, no PFO or other interatrial shunt. LDL 137, started lipitor 80 HgbA1c 5.0 VTE prophylaxis -none, she is currently on heparin drip    Diet   Diet Heart Room service appropriate? Yes; Fluid consistency: Thin  No antithrombotic prior to admission, now on heparin IV for ICA bifurcation thrombus. Therapy recommendations: Outpatient OT, no PT follow-up Disposition: Pending  Hypertension Home meds: None Long-term BP goal normotensive, started amlodipine 10 mg daily today.  Hyperlipidemia LDL 137, goal < 70 Added Lipitor 80  EtOH use Tobacco use Patient drinks 6-8 beers a day at home.  Discussed importance of cutting  down. Her last drink was approximately 72 hours ago. CIWA scores have been unremarkable and he she has not needed Ativan. She currently feels restless but does not have any other symptoms of withdrawal.  Currently using nicotine patches. -work on alcohol cessation outpatient, can try naltrexone in this patient who does not seem to have advanced liver disease - Currently using nicotine patches.  Recommended smoking cessation.  Can consider Chantix outpatient if she wants to quit smoking.    Hospital day # 1   To contact Stroke Continuity provider, please refer to [email or web address]. After hours, contact General Neurology

## 2022-11-03 NOTE — Progress Notes (Signed)
Occupational Therapy Treatment Patient Details Name: [name] MRN: [id number] DOB: 1961-10-06 Today's Date: 11/03/2022   History of present illness 61 yo female admitted to [facility] on 11/01/22 for L sided weakness and numbness over several weeks. MRI (+) scattered R hemispheric CVA, R parietal region, CTA (+) R ICA thrombus PMH current smoker. Pt transferred to [facility] hospital overnight 11/21. 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   OT comments  Orthotic arrived and fit to patient.  Discussed different donning strategies, leaving finger/thumb straps attached, slipping on like a glove, and adjusting straps as needed.  Reinforced night time usage.  Educated on tightness, sliding a finger under straps, and looking for indentations/red marks.  Educated to watch pinky finger and red areas, as that is the only finger pressing down on the splint.  Patient verbalized understanding to all teaching and wear schedule.  Continue OT in the acute setting as needed.      Recommendations for follow up therapy are one component of a multi-disciplinary discharge planning process, led by the attending physician.  Recommendations may be updated based on patient status, additional functional criteria and insurance authorization.    Follow Up Recommendations  Outpatient OT     Assistance Recommended at Discharge PRN  Patient can return home with the following  Assist for transportation   Equipment Recommendations  None recommended by OT    Recommendations for Other Services      Precautions / Restrictions Precautions Precautions: Fall Restrictions Weight Bearing Restrictions: No                                                Cognition Arousal/Alertness: Awake/alert Behavior During Therapy: Impulsive Overall Cognitive Status: Within Functional Limits for tasks assessed                                                              Pertinent Vitals/ Pain       Pain Assessment Pain Assessment: No/denies pain                                                          Frequency  Min 2X/week        Progress Toward Goals  OT Goals(current goals can now be found in the care plan section)  Progress towards OT goals: Progressing toward goals  Acute Rehab OT Goals OT Goal Formulation: With patient Time For Goal Achievement: 11/16/22 Potential to Achieve Goals: Good  Plan Discharge plan remains appropriate    Co-evaluation                 AM-PAC OT "6 Clicks" Daily Activity     Outcome Measure   Help from another person eating meals?: None Help from another person taking care of personal grooming?: None Help from another person toileting, which includes using toliet, bedpan, or urinal?: None Help from another person bathing (including washing, rinsing,  drying)?: None Help from another person to put on and taking off regular upper body clothing?: None Help from another person to put on and taking off regular lower body clothing?: None 6 Click Score: 24    End of Session    OT Visit Diagnosis: Hemiplegia and hemiparesis;Other abnormalities of gait and mobility (R26.89) Hemiplegia - Right/Left: Left Hemiplegia - dominant/non-dominant: Non-Dominant Hemiplegia - caused by: Cerebral infarction   Activity Tolerance Patient tolerated treatment well   Patient Left in bed;with call bell/phone within reach   Nurse Communication Other (comment) (ortho wear schedule)        Time: 7373-6681 OT Time Calculation (min): 19 min  Charges: OT General Charges $OT Visit: 1 Visit OT Treatments $Orthotics Fit/Training: 8-22 mins  11/03/2022  RP, OTR/L  Acute Rehabilitation Services  Office:  [phone]   [name] 11/03/2022, 1:37 PM

## 2022-11-04 LAB — BASIC METABOLIC PANEL
Anion gap: 11 (ref 5–15)
BUN: 6 mg/dL (ref 6–20)
CO2: 22 mmol/L (ref 22–32)
Calcium: 9 mg/dL (ref 8.9–10.3)
Chloride: 102 mmol/L (ref 98–111)
Creatinine, Ser: 0.64 mg/dL (ref 0.44–1.00)
GFR, Estimated: >60 mL/min (ref >60)
Glucose, Bld: 102 mg/dL — ABNORMAL HIGH (ref 70–99)
Potassium: 3.2 mmol/L — ABNORMAL LOW (ref 3.5–5.1)
Sodium: 135 mmol/L (ref 135–145)

## 2022-11-04 LAB — CBC
HCT: 42 % (ref 36.0–46.0)
Hemoglobin: 14.2 g/dL (ref 12.0–15.0)
MCH: 33.4 pg (ref 26.0–34.0)
MCHC: 33.8 g/dL (ref 30.0–36.0)
MCV: 98.8 fL (ref 80.0–100.0)
Platelets: 244 10*3/uL (ref 150–400)
RBC: 4.25 MIL/uL (ref 3.87–5.11)
RDW: 12.6 % (ref 11.5–15.5)
WBC: 8.3 10*3/uL (ref 4.0–10.5)
nRBC: 0 % (ref 0.0–0.2)

## 2022-11-04 LAB — HEPARIN LEVEL (UNFRACTIONATED)
Heparin Unfractionated: <0.1 IU/mL — ABNORMAL LOW (ref 0.30–0.70)
Heparin Unfractionated: 0.43 IU/mL (ref 0.30–0.70)

## 2022-11-04 MED ORDER — POTASSIUM CHLORIDE CRYS ER 20 MEQ PO TBCR
40.0000 meq | EXTENDED_RELEASE_TABLET | Freq: Once | ORAL | Status: AC
  Administered 2022-11-04: 40 meq via ORAL
  Filled 2022-11-04: qty 2

## 2022-11-04 MED ORDER — NICOTINE POLACRILEX 2 MG MT GUM
2.0000 mg | CHEWING_GUM | OROMUCOSAL | Status: DC | PRN
  Administered 2022-11-04 – 2022-11-06 (×6): 2 mg via ORAL
  Filled 2022-11-04 (×11): qty 1

## 2022-11-04 NOTE — Progress Notes (Signed)
STROKE TEAM PROGRESS NOTE   INTERVAL HISTORY -no PT f\u, outpatient OT with splint  -CIWAs unremarkable overall, but did receive one-time dose of Ativan 1 mg yesterday and 2 mg this morning -IV infiltrated yesterday with heparin and oriented arm.  Very little pain this morning in area of infiltration. - No acute concerns this morning.  She still feels very restless and irritable.  She does attribute some of this to nicotine cravings and possible alcohol withdrawal.  Vitals:   11/03/22 0941 11/03/22 1233 11/03/22 1620 11/04/22 0742  BP: (!) 174/98 (!) 153/85 (!) 126/97 (!) 155/96  Pulse: 73 74 79 84  Resp: 17 16 15 16   Temp: 97.6 F (36.4 C) 97.7 F (36.5 C) 98.1 F (36.7 C) 97.7 F (36.5 C)  TempSrc: Oral Oral Oral Oral  SpO2: 99% 99% 100% 98%  Weight:      Height:       CBC:  Recent Labs  Lab 11/01/22 0829 11/02/22 0529 11/03/22 0242 11/04/22 0239  WBC 4.7   < > 7.1 8.3  NEUTROABS 2.0  --   --   --   HGB 15.4*   < > 14.6 14.2  HCT 45.1   < > 43.1 42.0  MCV 97.8   < > 99.3 98.8  PLT 272   < > 245 244   < > = values in this interval not displayed.    Basic Metabolic Panel:  Recent Labs  Lab 11/02/22 0529 11/03/22 0242  NA 137 138  K 3.3* 4.0  CL 104 102  CO2 23 23  GLUCOSE 98 108*  BUN 7 5*  CREATININE 0.69 0.77  CALCIUM 9.0 9.5  MG  --  2.2  PHOS  --  4.0    Lipid Panel:  Recent Labs  Lab 11/02/22 0529  CHOL 218*  TRIG 69  HDL 67  CHOLHDL 3.3  VLDL 14  LDLCALC 137*    HgbA1c:  Recent Labs  Lab 11/01/22 1337  HGBA1C 5.0    Urine Drug Screen:  Recent Labs  Lab 11/01/22 0852  LABOPIA NONE DETECTED  COCAINSCRNUR NONE DETECTED  LABBENZ NONE DETECTED  AMPHETMU NONE DETECTED  THCU NONE DETECTED  LABBARB NONE DETECTED     Alcohol Level  Recent Labs  Lab 11/01/22 0829  ETH 47*     IMAGING past 24 hours No results found.  PHYSICAL EXAM General: NAD Extremities: Prior IV infiltration site on left forearm appears mildly edematous  but not indurated and with minimal tenderness palpation. Neuro: A&O x 4.  Good attention and is interactive on exam. Cranial nerves grossly intact. 5/5 strength in all extremities with exception of 4/5 strength in the left hand Psych: Restless, irritable.  Endorses 1 episode of confusion yesterday where she was walking to the bathroom but felt like she was at home and then was reoriented.   ASSESSMENT/PLAN Ms. REESE CLARKIN is a 61 y.o. female with history of alcohol abuse, current 2-3 pk/day smoking history, HTN, depression/GAD, dupuytren contracture presenting with left hand weakness and numbness for 3 weeks and found to have scattered R hemispheric infarcts with minimal petechial hemorrhage and R ICA bifurcation thrombus.   Stroke: Scattered right hemispheric infarcts Right ICA bifurcation thrombus Etiology: Likely embolic secondary to ICA thrombus Acute infarcts likely secondary to embolic shower from ICA bifurcation thrombus.  She was placed on heparin drip and managed conservatively.  Neuro exams have been stable so far.  Hopefully will stay for least 1 more day to  continue receiving heparin. If she acutely worsens on exam, plan is to take her for thrombectomy at that time. Code Stroke CT: Generalized atrophy with small vessel disease.  Small right parietal remote infarct, right parietal subacute infarct.  No hydrocephalus, bleed. CTA head & neck: Right ICA origin thrombus.  No LVO.  Atherosclerotic stenosis of left vertebral artery origin.  Proximal right ICA and bilateral ICA siphon atherosclerosis. MRI: Multiple acute right MCA territory infarcts involving right frontal, parietal, posterior right temporal lobe.  Small punctate focus of acute infarct in right PCA territory involving right occipital lobe.  Small petechial hemorrhage in low right parietal lobe associated with infarcts.  No large parenchymal hematoma, no SAH. 2D Echo done showing no valvular abnormalities, no heart failure,  no PFO or other interatrial shunt. LDL 137, started lipitor 80 HgbA1c 5.0 VTE prophylaxis -none, she is currently on heparin drip    Diet   Diet Heart Room service appropriate? Yes; Fluid consistency: Thin  No antithrombotic prior to admission, now on heparin IV for ICA bifurcation thrombus. Therapy recommendations: Outpatient OT, no PT follow-up Disposition: Pending  Hypertension Home meds: None Long-term BP goal normotensive, started amlodipine 10 mg daily today.  Hyperlipidemia LDL 137, goal < 70 Added Lipitor 80  EtOH use Tobacco use Patient drinks 6-8 beers a day at home.  Discussed importance of cutting down. Her last drink was approximately 4 days ago. She currently feels restless and agitated with 1 episode of confusion yesterday but no clear delirium or audiovisual hallucinations.  Currently using nicotine patches.  Required small doses of Ativan yesterday and today. - CIWA with Ativan -work on alcohol cessation outpatient, can try naltrexone in this patient who does not seem to have advanced liver disease - Currently using nicotine patches.  Recommended smoking cessation.  Can consider Chantix outpatient if she wants to quit smoking.  Prior IV infiltration site Left forearm IV site infiltrated with heparin monitor forearm overnight.  New IV placed.  Appears edematous but not indurated this morning with low concern for compartment syndrome, thrombophlebitis, hematoma.  Would continue to monitor at this time.   Hospital day # 2   To contact Stroke Continuity provider, please refer to [email or web address]. After hours, contact General Neurology

## 2022-11-04 NOTE — Progress Notes (Signed)
ANTICOAGULATION CONSULT NOTE - Follow Up Consult  Pharmacy Consult for Heparin Indication: Thrombus within the right ICA  No Known Allergies  Patient Measurements: Height: 5\' 4"  (162.6 cm) Weight: 59.2 kg (130 lb 8.2 oz) IBW/kg (Calculated) : 54.7 Heparin Dosing Weight: 59.2 kg  Vital Signs: Temp: 97.9 Bird (36.6 C) (11/24 0314) Temp Source: Oral (11/24 0314) BP: 129/71 (11/24 0314) Pulse Rate: 82 (11/24 0314)  Labs: Recent Labs    11/01/22 0829 11/01/22 2049 11/02/22 0529 11/02/22 1914 11/03/22 0242 11/04/22 0239  HGB 15.4*  --  14.3  --  14.6 14.2  HCT 45.1  --  41.4  --  43.1 42.0  PLT 272  --  230  --  245 244  APTT 26  --   --   --   --   --   LABPROT 12.7  --   --   --   --   --   INR 1.0  --   --   --   --   --   HEPARINUNFRC  --    < > 0.55 0.41 0.35 <0.10*  CREATININE 0.59  --  0.69  --  0.77  --    < > = values in this interval not displayed.     Estimated Creatinine Clearance: 65.4 mL/min (by C-G formula based on SCr of 0.77 mg/dL).   Medications:  Scheduled:   amLODipine  10 mg Oral Daily   atorvastatin  80 mg Oral Daily   Chlorhexidine Gluconate Cloth  6 each Topical Daily   folic acid  1 mg Oral Daily   LORazepam  0-4 mg Oral Q6H   Followed by   LORazepam  0-4 mg Oral Q12H   multivitamin with minerals  1 tablet Oral Daily   nicotine  21 mg Transdermal Daily   thiamine  100 mg Oral Daily   Infusions:   heparin 850 Units/hr (11/04/22 0456)    Assessment: 61 yo Bird presented to the ED with left arm weakness and heaviness. 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 Pharmacy consulted to dose heparin per stroke protocol. Patient is not on anticoagulation PTA.   Heparin level today is therapeutic at 0.43, on 850 units/hr. Hgb 14.2, plt 244 - stable. No line issues or signs/symptoms of bleeding noted per RN.  Goal of Therapy:  Heparin level 0.3-0.5 units/ml Monitor platelets by anticoagulation protocol: Yes   Plan:  Continue IV heparin at 850  units/hr. Daily CBC, heparin level. Monitor for signs/symptoms of bleeding.   46, PharmD  PGY1 Pharmacy Resident    Please check AMION for all [facility] Pharmacy phone numbers After 10:00 PM, call Main Pharmacy [phone]

## 2022-11-04 NOTE — Progress Notes (Signed)
ANTICOAGULATION CONSULT NOTE - Follow Up Consult   Heparin level undetectable this am.  D/w RN >> determined that IV had infiltrated, now IV re-established and heparin resumed.  Will recheck level in 8h.  Colleen Can 11/04/2022,5:13 AM

## 2022-11-04 NOTE — Progress Notes (Signed)
Occupational Therapy Treatment Patient Details Name: [name] MRN: [id number] DOB: [birth year] Today's Date: 11/04/2022   History of present illness 61 yo female admitted to [facility] on 11/01/22 for L sided weakness and numbness over several weeks. MRI (+) scattered R hemispheric CVA, R parietal region, CTA (+) R ICA thrombus PMH current smoker. Pt transferred to [facility] hospital overnight 11/21. 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   OT comments  Pt currently very lethargic noted to have ativan at 5:26 am which could be contributing to arousal. Pt able to verbalize to therapist and agreeable to second session. Pt states "you right about me not remembering anything right now. I dont know what is wrong with me. I can't wake up." Pt with splint don at this time to allow time sleeping in sleep for skin check when awake. RN notified to contact OT when family has arrived. Ot to check back as appropriate.     Recommendations for follow up therapy are one component of a multi-disciplinary discharge planning process, led by the attending physician.  Recommendations may be updated based on patient status, additional functional criteria and insurance authorization.    Follow Up Recommendations  Outpatient OT     Assistance Recommended at Discharge PRN  Patient can return home with the following  Assist for transportation   Equipment Recommendations  None recommended by OT    Recommendations for Other Services Rehab consult    Precautions / Restrictions Precautions Precautions: Fall Precaution Comments: low fall, chronic R ankle weakness       Mobility Bed Mobility                    Transfers                         Balance                                           ADL either performed or assessed with clinical judgement   ADL                                               Extremity/Trunk Assessment Upper Extremity Assessment Upper Extremity Assessment: LUE deficits/detail LUE Deficits / Details: pt reports increased difficulty lifting L UE. pt using hand hand over hand R UE to lift shoulder against gravity. pt with digit activation to verbal cues. pt unable to sustain attention for don doff brace. pt states "i dont know whats wrong with me"            Vision       Perception     Praxis      Cognition Arousal/Alertness: Lethargic Behavior During Therapy: Flat affect Overall Cognitive Status: Impaired/Different from baseline Area of Impairment: Memory                     Memory: Decreased short-term memory         General Comments: pt currently with decreased arousal and sustained attention. pt requires repetition of information. pt noted to have ativan 5:26am. OT educated patient and RN to call OT back when family has arrived for  continued education        Exercises Exercises: Other exercises Other Exercises Other Exercises: passage stretch and massage applied to the L UE. Splint don to hand and pt unable to assist at this time. Pt sleeping so splint don to assess when pt wakes for any pressure concerns. Splint modified slightly at thumb positioning for good alignment. OT to do second session with family present to ensure splint can be don doff properly and exercises for passage stretch and massage are unstood.    Shoulder Instructions       General Comments      Pertinent Vitals/ Pain       Pain Assessment Pain Assessment: No/denies pain  Home Living                                          Prior Functioning/Environment              Frequency  Min 2X/week        Progress Toward Goals  OT Goals(current goals can now be found in the care plan section)  Progress towards OT goals: Progressing toward goals  Acute Rehab OT Goals Patient Stated  Goal: to sleep OT Goal Formulation: With patient Time For Goal Achievement: 11/16/22 Potential to Achieve Goals: Good ADL Goals Additional ADL Goal #1: Pt will demonstrate don and doff of resting hand mod i Additional ADL Goal #2: Pt will demonstrate HEP for L hand MOD I  Plan Discharge plan remains appropriate    Co-evaluation                 AM-PAC OT "6 Clicks" Daily Activity     Outcome Measure   Help from another person eating meals?: None Help from another person taking care of personal grooming?: None Help from another person toileting, which includes using toliet, bedpan, or urinal?: None Help from another person bathing (including washing, rinsing, drying)?: None Help from another person to put on and taking off regular upper body clothing?: None Help from another person to put on and taking off regular lower body clothing?: None 6 Click Score: 24    End of Session    OT Visit Diagnosis: Hemiplegia and hemiparesis;Other abnormalities of gait and mobility (R26.89) Hemiplegia - Right/Left: Left Hemiplegia - dominant/non-dominant: Non-Dominant Hemiplegia - caused by: Cerebral infarction   Activity Tolerance Patient limited by lethargy   Patient Left in bed;with call bell/phone within reach;with bed alarm set   Nurse Communication Mobility status;Precautions        Time: 0830 [phone] OT Time Calculation (min): 8 min  Charges: OT General Charges $OT Visit: 1 Visit OT Treatments $Self Care/Home Management : 8-22 mins   Brynn, OTR/L  Acute Rehabilitation Services Office: [phone] .   Mateo Flow 11/04/2022, 10:33 AM

## 2022-11-05 ENCOUNTER — Inpatient Hospital Stay (HOSPITAL_COMMUNITY): Payer: Self-pay

## 2022-11-05 DIAGNOSIS — F17200 Nicotine dependence, unspecified, uncomplicated: Secondary | ICD-10-CM

## 2022-11-05 DIAGNOSIS — I639 Cerebral infarction, unspecified: Secondary | ICD-10-CM

## 2022-11-05 DIAGNOSIS — F1010 Alcohol abuse, uncomplicated: Secondary | ICD-10-CM

## 2022-11-05 LAB — BASIC METABOLIC PANEL
Anion gap: 12 (ref 5–15)
BUN: <5 mg/dL — ABNORMAL LOW (ref 6–20)
CO2: 21 mmol/L — ABNORMAL LOW (ref 22–32)
Calcium: 9.9 mg/dL (ref 8.9–10.3)
Chloride: 106 mmol/L (ref 98–111)
Creatinine, Ser: 0.54 mg/dL (ref 0.44–1.00)
GFR, Estimated: >60 mL/min (ref >60)
Glucose, Bld: 111 mg/dL — ABNORMAL HIGH (ref 70–99)
Potassium: 3.9 mmol/L (ref 3.5–5.1)
Sodium: 139 mmol/L (ref 135–145)

## 2022-11-05 LAB — CBC
HCT: 40.6 % (ref 36.0–46.0)
Hemoglobin: 14.1 g/dL (ref 12.0–15.0)
MCH: 33.9 pg (ref 26.0–34.0)
MCHC: 34.7 g/dL (ref 30.0–36.0)
MCV: 97.6 fL (ref 80.0–100.0)
Platelets: 226 10*3/uL (ref 150–400)
RBC: 4.16 MIL/uL (ref 3.87–5.11)
RDW: 12.5 % (ref 11.5–15.5)
WBC: 7.2 10*3/uL (ref 4.0–10.5)
nRBC: 0 % (ref 0.0–0.2)

## 2022-11-05 LAB — HEPARIN LEVEL (UNFRACTIONATED)
Heparin Unfractionated: 0.53 IU/mL (ref 0.30–0.70)
Heparin Unfractionated: 0.43 IU/mL (ref 0.30–0.70)

## 2022-11-05 MED ORDER — APIXABAN 5 MG PO TABS
5.0000 mg | ORAL_TABLET | Freq: Two times a day (BID) | ORAL | Status: DC
  Administered 2022-11-05 – 2022-11-07 (×4): 5 mg via ORAL
  Filled 2022-11-05 (×5): qty 1

## 2022-11-05 NOTE — Plan of Care (Signed)
  Problem: Education: Goal: Knowledge of disease or condition will improve Outcome: Progressing   Problem: Health Behavior/Discharge Planning: Goal: Ability to manage health-related needs will improve Outcome: Progressing   

## 2022-11-05 NOTE — Progress Notes (Signed)
ANTICOAGULATION CONSULT NOTE - Follow Up Consult  Pharmacy Consult for Heparin>>apixaban Indication: Thrombus within the right ICA  No Known Allergies  Patient Measurements: Height: 5\' 4"  (162.6 cm) Weight: 59.2 kg (130 lb 8.2 oz) IBW/kg (Calculated) : 54.7 Heparin Dosing Weight: 59.2 kg  Vital Signs: Temp: 98.1 F (36.7 C) (11/25 1542) Temp Source: Oral (11/25 1542) BP: 122/85 (11/25 1542) Pulse Rate: 91 (11/25 1542)  Labs: Recent Labs    11/03/22 0242 11/04/22 0239 11/04/22 0735 11/04/22 1259 11/05/22 0327 11/05/22 1439  HGB 14.6 14.2  --   --  14.1  --   HCT 43.1 42.0  --   --  40.6  --   PLT 245 244  --   --  226  --   HEPARINUNFRC 0.35 <0.10*  --  0.43 0.53 0.43  CREATININE 0.77  --  0.64  --  0.54  --      Estimated Creatinine Clearance: 65.4 mL/min (by C-G formula based on SCr of 0.54 mg/dL).   Medications:  Scheduled:   amLODipine  10 mg Oral Daily   atorvastatin  80 mg Oral Daily   Chlorhexidine Gluconate Cloth  6 each Topical Daily   folic acid  1 mg Oral Daily   LORazepam  0-4 mg Oral Q12H   multivitamin with minerals  1 tablet Oral Daily   nicotine  21 mg Transdermal Daily   thiamine  100 mg Oral Daily   Infusions:   heparin 800 Units/hr (11/05/22 11/07/22)    Assessment: 61 yo F presented to the ED with left arm weakness and heaviness. 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 Pharmacy consulted to dose heparin per stroke protocol. Patient is not on anticoagulation PTA.   Heparin level today is supratherapeutic at 0.53, on 850 units/hr. Hgb 14.1, plt 226 - stable. No line issues or signs/symptoms of bleeding noted per RN.  Addendum Plan to transition to PO apixaban tonight for anticoagulation.   Goal of Therapy:  Monitor platelets by anticoagulation protocol: Yes   Plan:  Dc heparin Apixaban 5mg  PO BID Rx will follow peripherally  46, PharmD, BCIDP, AAHIVP, CPP Infectious Disease Pharmacist 11/05/2022 6:41 PM

## 2022-11-05 NOTE — Progress Notes (Signed)
VASCULAR LAB    Bilateral lower extremity venous duplex has been performed.  See CV proc for preliminary results.   Khalea Ventura, RVT 11/05/2022, 11:24 AM

## 2022-11-05 NOTE — Progress Notes (Signed)
STROKE TEAM PROGRESS NOTE   SUBJECTIVE (INTERVAL HISTORY) Her husband is at the bedside.  Overall her condition is stable.  Still has mild left arm weakness.  On heparin IV, plan to transition to Eliquis today.  Discussed with patient, will consider TEE on Monday.   OBJECTIVE Temp:  [97.6 F (36.4 C)-98.1 F (36.7 C)] 98.1 F (36.7 C) (11/25 1918) Pulse Rate:  [91-102] 102 (11/25 1918) Cardiac Rhythm: Normal sinus rhythm (11/25 1900) Resp:  [14-20] 18 (11/25 1918) BP: (106-145)/(76-96) 125/89 (11/25 1918) SpO2:  [99 %-100 %] 99 % (11/25 1918)  No results for input(s): "GLUCAP" in the last 168 hours. Recent Labs  Lab 11/01/22 0829 11/02/22 0529 11/03/22 0242 11/04/22 0735 11/05/22 0327  NA 140 137 138 135 139  K 3.9 3.3* 4.0 3.2* 3.9  CL 109 104 102 102 106  CO2 21*  GLUCOSE 84 98 108* 102* 111*  BUN 8 7 5* 6 <5*  CREATININE 0.59 0.69 0.77 0.64 0.54  CALCIUM 9.0 9.0 9.5 9.0 9.9  MG  --   --  2.2  --   --   PHOS  --   --  4.0  --   --    Recent Labs  Lab 11/01/22 0829 11/02/22 0529  AST 42* 31  ALT 30 27  ALKPHOS 94 82  BILITOT 0.5 1.0  PROT 7.7 6.5  ALBUMIN 4.0 3.4*   Recent Labs  Lab 11/01/22 0829 11/02/22 0529 11/03/22 0242 11/04/22 0239 11/05/22 0327  WBC 4.7 7.2 7.1 8.3 7.2  NEUTROABS 2.0  --   --   --   --   HGB 15.4* 14.3 14.6 14.2 14.1  HCT 45.1 41.4 43.1 42.0 40.6  MCV 97.8 97.0 99.3 98.8 97.6  PLT 272 230 245 244 226   No results for input(s): "CKTOTAL", "CKMB", "CKMBINDEX", "TROPONINI" in the last 168 hours. No results for input(s): "LABPROT", "INR" in the last 72 hours. No results for input(s): "COLORURINE", "LABSPEC", "PHURINE", "GLUCOSEU", "HGBUR", "BILIRUBINUR", "KETONESUR", "PROTEINUR", "UROBILINOGEN", "NITRITE", "LEUKOCYTESUR" in the last 72 hours.  Invalid input(s): "APPERANCEUR"     Component Value Date/Time   CHOL 218 (H) 11/02/2022 0529   TRIG 69 11/02/2022 0529   HDL 67 11/02/2022 0529   CHOLHDL 3.3 11/02/2022 0529    VLDL 14 11/02/2022 0529   LDLCALC 137 (H) 11/02/2022 0529   Lab Results  Component Value Date   HGBA1C 5.0 11/01/2022      Component Value Date/Time   LABOPIA NONE DETECTED 11/01/2022 0852   LABOPIA NONE DETECTED 06/17/2007 0110   COCAINSCRNUR NONE DETECTED 11/01/2022 0852   LABBENZ NONE DETECTED 11/01/2022 0852   LABBENZ NONE DETECTED 06/17/2007 0110   AMPHETMU NONE DETECTED 11/01/2022 0852   AMPHETMU NONE DETECTED 06/17/2007 0110   THCU NONE DETECTED 11/01/2022 0852   THCU NONE DETECTED 06/17/2007 0110   LABBARB NONE DETECTED 11/01/2022 0852   LABBARB  06/17/2007 0110    NONE DETECTED        DRUG SCREEN FOR MEDICAL PURPOSES ONLY.  IF CONFIRMATION IS NEEDED FOR ANY PURPOSE, NOTIFY LAB WITHIN 5 DAYS.    Recent Labs  Lab 11/01/22 0829  ETH 47*    I have personally reviewed the radiological images below and agree with the radiology interpretations.  VAS [place] LOWER EXTREMITY VENOUS (DVT)  Result Date: 11/05/2022  Lower Venous DVT Study Patient Name:  [name]  Date of Exam:   11/05/2022 Medical Rec #: [id number]  Accession #:    [id number] Date of Birth: [birth year]       Patient Gender: F Patient Age:   [age] years [facility] Procedure:      VAS US LOWER EXTREMITY VENOUS (DVT) Referring Phys: Scheryl MartenJINDONG Mariyah Upshaw --------------------------------------------------------------------------------  Indications: Stroke.  Comparison Study: No prior study on file Performing Technologist: Sherren Kernsandace Kanady RVS  Examination Guidelines: A complete evaluation includes B-mode imaging, spectral Doppler, color Doppler, and power Doppler as needed of all accessible portions of each vessel. Bilateral testing is considered an integral part of a complete examination. Limited examinations for reoccurring indications may be performed as noted. The reflux portion of the exam is performed with the patient in reverse Trendelenburg.   +---------+---------------+---------+-----------+----------+--------------+ RIGHT    CompressibilityPhasicitySpontaneityPropertiesThrombus Aging +---------+---------------+---------+-----------+----------+--------------+ CFV      Full           Yes      Yes                                 +---------+---------------+---------+-----------+----------+--------------+ SFJ      Full                                                        +---------+---------------+---------+-----------+----------+--------------+ FV Prox  Full                                                        +---------+---------------+---------+-----------+----------+--------------+ FV Mid   Full                                                        +---------+---------------+---------+-----------+----------+--------------+ FV DistalFull                                                        +---------+---------------+---------+-----------+----------+--------------+ PFV      Full                                                        +---------+---------------+---------+-----------+----------+--------------+ POP      Full           Yes      Yes                                 +---------+---------------+---------+-----------+----------+--------------+ PTV      Full                                                        +---------+---------------+---------+-----------+----------+--------------+  PERO     Full                                                        +---------+---------------+---------+-----------+----------+--------------+   +---------+---------------+---------+-----------+----------+--------------+ LEFT     CompressibilityPhasicitySpontaneityPropertiesThrombus Aging +---------+---------------+---------+-----------+----------+--------------+ CFV      Full           Yes      Yes                                  +---------+---------------+---------+-----------+----------+--------------+ SFJ      Full                                                        +---------+---------------+---------+-----------+----------+--------------+ FV Prox  Full                                                        +---------+---------------+---------+-----------+----------+--------------+ FV Mid   Full                                                        +---------+---------------+---------+-----------+----------+--------------+ FV DistalFull                                                        +---------+---------------+---------+-----------+----------+--------------+ PFV      Full                                                        +---------+---------------+---------+-----------+----------+--------------+ POP      Full           Yes      Yes                                 +---------+---------------+---------+-----------+----------+--------------+ PTV      Full                                                        +---------+---------------+---------+-----------+----------+--------------+ PERO     Full                                                        +---------+---------------+---------+-----------+----------+--------------+  Summary: BILATERAL: - No evidence of deep vein thrombosis seen in the lower extremities, bilaterally. -No evidence of popliteal cyst, bilaterally.   *See table(s) above for measurements and observations. Electronically signed by Coral Else MD on 11/05/2022 at 8:52:41 PM.    Final    ECHOCARDIOGRAM COMPLETE BUBBLE STUDY  Result Date: 11/01/2022    ECHOCARDIOGRAM REPORT   Patient Name:   [name] Date of Exam: 11/01/2022 Medical Rec #:  [id number]      Height:       64.0 in Accession #:    [id number]     Weight:       128.0 lb Date of Birth:  [birth year]      BSA:          1.618 m Patient Age:    61 years       BP:           140/97  mmHg Patient Gender: F              HR:           72 bpm. Exam Location:  [facility] Procedure: 2D Echo, Cardiac Doppler, Color Doppler and Saline Contrast Bubble            Study Indications:     Stroke 434.91 / I63.9  History:         Patient has no prior history of Echocardiogram examinations.                  Risk Factors:Hypertension. Alcohol and tobacco abuse.  Sonographer:     Cristela Blue Referring Phys:  [id number] Malachi Carl STACK Diagnosing Phys: Arnoldo Hooker MD  Sonographer Comments: Technically challenging study due to limited acoustic windows, suboptimal apical window and suboptimal parasternal window. IMPRESSIONS  1. Left ventricular ejection fraction, by estimation, is 55 to 60%. The left ventricle has normal function. The left ventricle has no regional wall motion abnormalities. Left ventricular diastolic parameters were normal.  2. Right ventricular systolic function is normal. The right ventricular size is normal.  3. The mitral valve is normal in structure. Trivial mitral valve regurgitation.  4. The aortic valve is normal in structure. Aortic valve regurgitation is not visualized.  5. Agitated saline contrast bubble study was negative, with no evidence of any interatrial shunt. FINDINGS  Left Ventricle: Left ventricular ejection fraction, by estimation, is 55 to 60%. The left ventricle has normal function. The left ventricle has no regional wall motion abnormalities. The left ventricular internal cavity size was normal in size. There is  no left ventricular hypertrophy. Left ventricular diastolic parameters were normal. Right Ventricle: The right ventricular size is normal. No increase in right ventricular wall thickness. Right ventricular systolic function is normal. Left Atrium: Left atrial size was normal in size. Right Atrium: Right atrial size was normal in size. Pericardium: There is no evidence of pericardial effusion. Mitral Valve: The mitral valve is normal in structure. Trivial mitral valve  regurgitation. Tricuspid Valve: The tricuspid valve is normal in structure. Tricuspid valve regurgitation is trivial. Aortic Valve: The aortic valve is normal in structure. Aortic valve regurgitation is not visualized. Aortic valve mean gradient measures 3.0 mmHg. Aortic valve peak gradient measures 5.5 mmHg. Aortic valve area, by VTI measures 2.62 cm. Pulmonic Valve: The pulmonic valve was normal in structure. Pulmonic valve regurgitation is trivial. Aorta: The aortic root and ascending aorta are structurally normal, with no evidence of dilitation. IAS/Shunts: No atrial level shunt detected by color flow Doppler.  Agitated saline contrast was given intravenously to evaluate for intracardiac shunting. Agitated saline contrast bubble study was negative, with no evidence of any interatrial shunt.  LEFT VENTRICLE PLAX 2D LVIDd:         3.70 cm   Diastology LVIDs:         2.70 cm   LV e' medial:    5.66 cm/s LV PW:         1.00 cm   LV E/e' medial:  11.8 LV IVS:        0.80 cm   LV e' lateral:   7.29 cm/s LVOT diam:     2.00 cm   LV E/e' lateral: 9.1 LV SV:         57 LV SV Index:   35 LVOT Area:     3.14 cm  RIGHT VENTRICLE RV Basal diam:  2.70 cm RV Mid diam:    2.20 cm RV S prime:     12.80 cm/s TAPSE (M-mode): 1.9 cm LEFT ATRIUM             Index        RIGHT ATRIUM          Index LA diam:        2.70 cm 1.67 cm/m   RA Area:     7.63 cm LA Vol (A2C):   24.7 ml 15.26 ml/m  RA Volume:   16.20 ml 10.01 ml/m LA Vol (A4C):   38.6 ml 23.85 ml/m LA Biplane Vol: 31.7 ml 19.59 ml/m  AORTIC VALVE AV Area (Vmax):    2.31 cm AV Area (Vmean):   2.22 cm AV Area (VTI):     2.62 cm AV Vmax:           117.00 cm/s AV Vmean:          79.200 cm/s AV VTI:            0.217 m AV Peak Grad:      5.5 mmHg AV Mean Grad:      3.0 mmHg LVOT Vmax:         86.20 cm/s LVOT Vmean:        56.000 cm/s LVOT VTI:          0.181 m LVOT/AV VTI ratio: 0.83  AORTA Ao Root diam: 2.87 cm MITRAL VALVE                TRICUSPID VALVE MV Area (PHT):  3.39 cm     TR Peak grad:   17.6 mmHg MV Decel Time: 224 msec     TR Vmax:        210.00 cm/s MV E velocity: 66.60 cm/s MV A velocity: 101.00 cm/s  SHUNTS MV E/A ratio:  0.66         Systemic VTI:  0.18 m                             Systemic Diam: 2.00 cm Arnoldo Hooker MD Electronically signed by Arnoldo Hooker MD Signature Date/Time: 11/01/2022/4:54:18 PM    Final    MR BRAIN WO CONTRAST  Result Date: 11/01/2022 CLINICAL DATA:  Stroke suspected. EXAM: MRI HEAD WITHOUT CONTRAST TECHNIQUE: Multiplanar, multiecho pulse sequences of the brain and surrounding structures were obtained without intravenous contrast. COMPARISON:  Same-day CT brain and head and neck angiogram. FINDINGS: Brain: There are multiple acute infarcts in the right MCA territory involving the right frontal lobe (series  5, image 25), right parietal lobe (series 5, image 37-34, 28, 25), in the posterior aspect of the right temporal lobe (series 5, image 20). There is an additional small punctate focus of acute infarct in the posteroinferior right occipital c lobe, which is likely in the right PCA territory (series 5, image 14). There is a chronic infarct in the high right parietal lobe (series 15, image 42). There is sequela of mild chronic microvascular ischemic change. There is a small amount of petechial hemorrhage associated with infarcts in the low right parietal lobe. No large parenchymal hematoma is visualized. Vascular: Normal flow voids. Skull and upper cervical spine: Normal marrow signal. Sinuses/Orbits: Negative. Other: None IMPRESSION: 1. Multiple acute infarcts in the right MCA territory involving the right frontal lobe, right parietal lobe, and posterior aspect of the right temporal lobe. Given findings on same-day CTA head/neck angiogram, these are favored to be embolic. 2. There is an additional small punctate focus of acute infarct in the right PCA territory involving the right occipital lobe. 3. Small amount of petechial  hemorrhage associated with infarcts in the low right parietal lobe. No large parenchymal hematoma is visualized. Electronically Signed   By: Lorenza Cambridge M.D.   On: 11/01/2022 12:54   CT ANGIO HEAD NECK W WO CM  Addendum Date: 11/01/2022   ADDENDUM REPORT: 11/01/2022 09:54 ADDENDUM: #1 was discussed by telephone with Dr. Pilar Jarvis on 11/01/2022 at 0937 hours. Electronically Signed   By: Odessa Fleming M.D.   On: 11/01/2022 09:54   Result Date: 11/01/2022 CLINICAL DATA:  61 year old female with left side numbness, tingling, paresthesia. EXAM: CT ANGIOGRAPHY HEAD AND NECK TECHNIQUE: Multidetector CT imaging of the head and neck was performed using the standard protocol during bolus administration of intravenous contrast. Multiplanar CT image reconstructions and MIPs were obtained to evaluate the vascular anatomy. Carotid stenosis measurements (when applicable) are obtained utilizing NASCET criteria, using the distal internal carotid diameter as the denominator. RADIATION DOSE REDUCTION: This exam was performed according to the departmental dose-optimization program which includes automated exposure control, adjustment of the mA and/or kV according to patient size and/or use of iterative reconstruction technique. CONTRAST:  72mL OMNIPAQUE IOHEXOL 350 MG/ML SOLN COMPARISON:  Plain head CT today 0837 hours. FINDINGS: CTA NECK Skeleton: Cervical spine degeneration with some bulky endplate changes. No acute osseous abnormality identified. Congenital incomplete ossification of the posterior C1 ring. Upper chest: Asymmetric centrilobular emphysema and scarring in the right upper lobe. Negative visible superior mediastinum. Other neck: No acute finding. Aortic arch: 3 vessel arch configuration. Mild arch tortuosity. Minimal arch atherosclerosis. Right carotid system: Mildly tortuous brachiocephalic artery with some soft plaque but no stenosis. Tortuous proximal right CCA. Minimal right CCA plaque. But at the right ICA  origin there is fairly bulky soft and atherosclerotic plaque with evidence of adherent thrombus and thrombus located centrally within the right ICA bulb. See series 4, image 94 and series 10, image 46. Subsequent high-grade stenosis. But the right ICA remains patent to the skull base. Left carotid system: Mildly tortuous left CCA with no significant plaque or stenosis. Soft plaque at the posterior left ICA origin and calcified plaque at the distal right ICA bulb. Less than 50 % stenosis with respect to the distal vessel. Tortuous left ICA below the skull base. Vertebral arteries: Soft plaque at the right subclavian artery origin without stenosis. Normal right vertebral artery origin. Right vertebral artery appears patent and dominant to the skull base without stenosis. Proximal left subclavian artery  soft plaque without stenosis. Soft plaque and moderate or severe stenosis at the left vertebral artery origin on series 8, image 118. The left vertebral is non dominant, diminutive but patent to the skull base. There is subtle asymmetric decreased enhancement of the left vertebral compared to the right. CTA HEAD Posterior circulation: Relatively dominant although normal caliber right vertebral V4 segment is patent to the basilar without stenosis. Patent right PICA origin. Patent but asymmetrically decreased enhancement of the left vertebral V4 segment, which is diminutive beyond the left PICA which remains patent. Small contribution to the vertebrobasilar junction. Patent although diminutive Basilar artery. Patent SCA and left PCA origins. Fetal type right PCA origin. Diminutive or absent left posterior communicating artery. Bilateral PCA branches are patent with no definite stenosis. Anterior circulation: Both ICA siphons are patent with fairly symmetric enhancement. Mild to moderate bilateral siphon calcified plaque with no significant stenosis. Normal right posterior communicating artery origin. Patent carotid  termini. Patent MCA and ACA origins. Dominant left and diminutive right ACA A1 segments. Anterior communicating artery is also diminutive. Left ACA A2 somewhat dominant, but bilateral ACA branches appear patent without stenosis. Left MCA M1 segment and trifurcation are patent without stenosis. Left MCA branches are within normal limits. Right MCA M1 segment and bifurcation are patent without stenosis. Right MCA branches appear within normal limits, no convincing branch occlusion. Venous sinuses: Early contrast timing, not well evaluated. Anatomic variants: Fetal type right PCA origin. Dominant appearing right vertebral artery, although may beyond the basis of left vertebral origin stenosis. Dominant left and diminutive right ACA A1 segments. Review of the MIP images confirms the above findings IMPRESSION: 1. 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 But the Right ICA and anterior circulation remain patent. No superimposed large vessel occlusion. 2. Positive also for moderate to severe atherosclerotic stenosis at the Left Vertebral Artery origin, with evidence of asymmetrically decreased flow in the left vertebral compared to the right. 3. Proximal right ICA and bilateral ICA siphon atherosclerosis but no other significant arterial stenosis in the head or neck. 4.  Emphysema (ICD10-J43.9). Electronically Signed: By: Odessa Fleming M.D. On: 11/01/2022 09:35   CT HEAD WO CONTRAST  Result Date: 11/01/2022 CLINICAL DATA:  LEFT side numbness and tingling, paresthesias, LEFT arm heaviness, symptoms for last few weeks EXAM: CT HEAD WITHOUT CONTRAST TECHNIQUE: Contiguous axial images were obtained from the base of the skull through the vertex without intravenous contrast. RADIATION DOSE REDUCTION: This exam was performed according to the departmental dose-optimization program which includes automated exposure control,  adjustment of the mA and/or kV according to patient size and/or use of iterative reconstruction technique. COMPARISON:  04/07/2006 FINDINGS: Brain: Generalized atrophy. Normal ventricular morphology. No midline shift or mass effect. Small old cortical infarct high RIGHT parietal region. Additional likely subacute infarct posterior RIGHT parietal region. Minor small vessel chronic ischemic changes of deep cerebral white matter. No intracranial hemorrhage, mass lesion, or definite acute infarction. No extra-axial fluid collections. Vascular: No hyperdense vessels. Minimal atherosclerotic calcifications of internal carotid arteries at skull base Skull: Intact Sinuses/Orbits: Clear Other: N/A IMPRESSION: Atrophy with small vessel chronic ischemic changes of deep cerebral white matter. Small old cortical infarct high RIGHT parietal region. Additional likely subacute infarct posterior RIGHT parietal region; this could be better assessed by MR. No acute intracranial abnormalities. Electronically Signed   By: Ulyses Southward M.D.   On: 11/01/2022 08:43  PHYSICAL EXAM  Temp:  [97.6 F (36.4 C)-98.1 F (36.7 C)] 98.1 F (36.7 C) (11/25 1918) Pulse Rate:  [91-102] 102 (11/25 1918) Resp:  [14-20] 18 (11/25 1918) BP: (106-145)/(76-96) 125/89 (11/25 1918) SpO2:  [99 %-100 %] 99 % (11/25 1918)  General - Well nourished, well developed, in no apparent distress.  Ophthalmologic - fundi not visualized due to noncooperation.  Cardiovascular - Regular rhythm and rate.  Mental Status -  Level of arousal and orientation to time, place, and person were intact. Language including expression, naming, repetition, comprehension was assessed and found intact. Fund of Knowledge was assessed and was intact.  Cranial Nerves II - XII - II - Visual field intact OU. III, IV, VI - Extraocular movements intact. V - Facial sensation intact bilaterally. VII - Facial movement intact bilaterally. VIII - Hearing & vestibular  intact bilaterally. X - Palate elevates symmetrically. XI - Chin turning & shoulder shrug intact bilaterally. XII - Tongue protrusion intact.  Motor Strength - The patient's strength was normal in all extremities except left UE bicep and tricep 4/5 and finger grip 4+/5 with chronic dupuytren contracture.  Bulk was normal and fasciculations were absent.    Motor Tone - Muscle tone was assessed at the neck and appendages and was normal.  Reflexes - The patient's reflexes were symmetrical in all extremities and she had no pathological reflexes.  Sensory - Light touch, temperature/pinprick were assessed and were symmetrical except left UE mildly decreased light touch sensation.    Coordination - The patient had normal movements in the hands and feet with no ataxia or dysmetria but slow with left FTN.  Tremor was absent.  Gait and Station - deferred.   ASSESSMENT/PLAN [name] is a 61 y.o. female with history of smoker, alcohol abuse, hypertension admitted for left hand weakness and numbness for 3 weeks. No tPA given due to outside window.    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 TEE pending LDL 137 HgbA1c 5.0 UDS positive for THC Hypercoagulable work-up pending  Heparin IV for VTE prophylaxis No antithrombotic prior to admission, now on heparin IV.  Will transition to Eliquis. Patient counseled to be compliant with her antithrombotic medications Ongoing aggressive stroke risk factor management Therapy recommendations: Outpatient OT Disposition: Pending  Hypertension Stable Long term BP goal normotensive  Hyperlipidemia Home meds: None LDL 137, goal < 70 Now on Lipitor  80 Continue statin at discharge  Tobacco abuse Current smoker Smoking cessation counseling provided Nicotine patch provided Pt is willing to quit  Alcohol abuse On CIWA protocol B1/FA/multivitamin Alcohol limitation education provided  Other Stroke Risk Factors Substance abuse, UDS positive for THC.  THC cessation education provided.  Other Active Problems   Hospital day # 3    Marvel Plan, MD PhD Stroke Neurology 11/05/2022 9:59 PM    To contact Stroke Continuity provider, please refer to [email or web address]. After hours, contact General Neurology

## 2022-11-05 NOTE — Progress Notes (Addendum)
ANTICOAGULATION CONSULT NOTE - Follow Up Consult  Pharmacy Consult for Heparin Indication: Thrombus within the right ICA  No Known Allergies  Patient Measurements: Height: 5\' 4"  (162.6 cm) Weight: 59.2 kg (130 lb 8.2 oz) IBW/kg (Calculated) : 54.7 Heparin Dosing Weight: 59.2 kg  Vital Signs: Temp: 98.1 F (36.7 C) (11/25 1542) Temp Source: Oral (11/25 1542) BP: 122/85 (11/25 1542) Pulse Rate: 91 (11/25 1542)  Labs: Recent Labs    11/03/22 0242 11/04/22 0239 11/04/22 0735 11/04/22 1259 11/05/22 0327 11/05/22 1439  HGB 14.6 14.2  --   --  14.1  --   HCT 43.1 42.0  --   --  40.6  --   PLT 245 244  --   --  226  --   HEPARINUNFRC 0.35 <0.10*  --  0.43 0.53 0.43  CREATININE 0.77  --  0.64  --  0.54  --     Estimated Creatinine Clearance: 65.4 mL/min (by C-G formula based on SCr of 0.54 mg/dL).   Medications:  Scheduled:   amLODipine  10 mg Oral Daily   atorvastatin  80 mg Oral Daily   Chlorhexidine Gluconate Cloth  6 each Topical Daily   folic acid  1 mg Oral Daily   LORazepam  0-4 mg Oral Q12H   multivitamin with minerals  1 tablet Oral Daily   nicotine  21 mg Transdermal Daily   thiamine  100 mg Oral Daily   Infusions:   heparin 800 Units/hr (11/05/22 11/07/22)    Assessment: 61 yo F presented to the ED with left arm weakness and heaviness. 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 Pharmacy consulted to dose heparin per stroke protocol. Patient is not on anticoagulation PTA.   Heparin level today is supratherapeutic at 0.53, on 850 units/hr. Hgb 14.1, plt 226 - stable. No line issues or signs/symptoms of bleeding noted per RN.  Goal of Therapy:  Heparin level 0.3-0.5 units/ml Monitor platelets by anticoagulation protocol: Yes   Plan:  Decrease IV heparin to 800 units/hr. Check ~6 hr heparin level.  Daily CBC, heparin level. Monitor for signs/symptoms of bleeding.  ADDENDUM: ~6 hr heparin level is therapeutic at 0.43, on 800 units/hr. CBC stable. No line  issues or signs/symptoms of bleeding noted per RN.   Continue IV heparin at 800 units/hr.  Daily CBC, heparin level.  Monitor for signs/symptoms of bleeding.    48, PharmD PGY-2 Pharmacy Resident Phone [phone] 11/05/2022 3:59 PM   Please check AMION for all [facility] Pharmacy phone numbers After 10:00 PM, call Main Pharmacy [phone]

## 2022-11-06 DIAGNOSIS — I6522 Occlusion and stenosis of left carotid artery: Secondary | ICD-10-CM

## 2022-11-06 MED ORDER — SODIUM CHLORIDE 0.9 % IV SOLN: INTRAVENOUS | Status: DC

## 2022-11-06 NOTE — Progress Notes (Signed)
STROKE TEAM PROGRESS NOTE   SUBJECTIVE (INTERVAL HISTORY) No family is at the bedside.  Patient neuro stable, heparin IV has switched to Eliquis.  Pending EEG tomorrow.    OBJECTIVE Temp:  [97.9 F (36.6 C)-98.6 F (37 C)] 98.6 F (37 C) (11/26 1212) Pulse Rate:  [83-102] 88 (11/26 1212) Cardiac Rhythm: Sinus tachycardia (11/26 0700) Resp:  [15-18] 18 (11/26 1212) BP: (103-148)/(54-90) 119/74 (11/26 1212) SpO2:  [98 %-100 %] 100 % (11/26 1212)  No results for input(s): "GLUCAP" in the last 168 hours. Recent Labs  Lab 11/01/22 0829 11/02/22 0529 11/03/22 0242 11/04/22 0735 11/05/22 0327  NA 140 137 138 135 139  K 3.9 3.3* 4.0 3.2* 3.9  CL 109 104 102 102 106  CO2 24 23 23 22  21*  GLUCOSE 84 98 108* 102* 111*  BUN 8 7 5* 6 <5*  CREATININE 0.59 0.69 0.77 0.64 0.54  CALCIUM 9.0 9.0 9.5 9.0 9.9  MG  --   --  2.2  --   --   PHOS  --   --  4.0  --   --    Recent Labs  Lab 11/01/22 0829 11/02/22 0529  AST 42* 31  ALT 30 27  ALKPHOS 94 82  BILITOT 0.5 1.0  PROT 7.7 6.5  ALBUMIN 4.0 3.4*   Recent Labs  Lab 11/01/22 0829 11/02/22 0529 11/03/22 0242 11/04/22 0239 11/05/22 0327  WBC 4.7 7.2 7.1 8.3 7.2  NEUTROABS 2.0  --   --   --   --   HGB 15.4* 14.3 14.6 14.2 14.1  HCT 45.1 41.4 43.1 42.0 40.6  MCV 97.8 97.0 99.3 98.8 97.6  PLT 272 230 245 244 226   No results for input(s): "CKTOTAL", "CKMB", "CKMBINDEX", "TROPONINI" in the last 168 hours. No results for input(s): "LABPROT", "INR" in the last 72 hours. No results for input(s): "COLORURINE", "LABSPEC", "PHURINE", "GLUCOSEU", "HGBUR", "BILIRUBINUR", "KETONESUR", "PROTEINUR", "UROBILINOGEN", "NITRITE", "LEUKOCYTESUR" in the last 72 hours.  Invalid input(s): "APPERANCEUR"     Component Value Date/Time   CHOL 218 (H) 11/02/2022 0529   TRIG 69 11/02/2022 0529   HDL 67 11/02/2022 0529   CHOLHDL 3.3 11/02/2022 0529   VLDL 14 11/02/2022 0529   LDLCALC 137 (H) 11/02/2022 0529   Lab Results  Component Value Date    HGBA1C 5.0 11/01/2022      Component Value Date/Time   LABOPIA NONE DETECTED 11/01/2022 0852   LABOPIA NONE DETECTED 06/17/2007 0110   COCAINSCRNUR NONE DETECTED 11/01/2022 0852   LABBENZ NONE DETECTED 11/01/2022 0852   LABBENZ NONE DETECTED 06/17/2007 0110   AMPHETMU NONE DETECTED 11/01/2022 0852   AMPHETMU NONE DETECTED 06/17/2007 0110   THCU NONE DETECTED 11/01/2022 0852   THCU NONE DETECTED 06/17/2007 0110   LABBARB NONE DETECTED 11/01/2022 0852   LABBARB  06/17/2007 0110    NONE DETECTED        DRUG SCREEN FOR MEDICAL PURPOSES ONLY.  IF CONFIRMATION IS NEEDED FOR ANY PURPOSE, NOTIFY LAB WITHIN 5 DAYS.    Recent Labs  Lab 11/01/22 0829  ETH 47*    I have personally reviewed the radiological images below and agree with the radiology interpretations.  VAS US LOWER EXTREMITY VENOUS (DVT)  Result Date: 11/05/2022  Lower Venous DVT Study Patient Name:  [name]  Date of Exam:   11/05/2022 Medical Rec #: [id number]       Accession #:    [id number] Date of Birth: 61 years  Patient Gender: F Patient Age:   61 years Exam Location:  [facility] Procedure:      VAS [place] LOWER EXTREMITY VENOUS (DVT) Referring Phys: Scheryl Marten Jaquise Faux --------------------------------------------------------------------------------  Indications: Stroke.  Comparison Study: No prior study on file Performing Technologist: Sherren Kerns RVS  Examination Guidelines: A complete evaluation includes B-mode imaging, spectral Doppler, color Doppler, and power Doppler as needed of all accessible portions of each vessel. Bilateral testing is considered an integral part of a complete examination. Limited examinations for reoccurring indications may be performed as noted. The reflux portion of the exam is performed with the patient in reverse Trendelenburg.  +---------+---------------+---------+-----------+----------+--------------+ RIGHT    CompressibilityPhasicitySpontaneityPropertiesThrombus Aging  +---------+---------------+---------+-----------+----------+--------------+ CFV      Full           Yes      Yes                                 +---------+---------------+---------+-----------+----------+--------------+ SFJ      Full                                                        +---------+---------------+---------+-----------+----------+--------------+ FV Prox  Full                                                        +---------+---------------+---------+-----------+----------+--------------+ FV Mid   Full                                                        +---------+---------------+---------+-----------+----------+--------------+ FV DistalFull                                                        +---------+---------------+---------+-----------+----------+--------------+ PFV      Full                                                        +---------+---------------+---------+-----------+----------+--------------+ POP      Full           Yes      Yes                                 +---------+---------------+---------+-----------+----------+--------------+ PTV      Full                                                        +---------+---------------+---------+-----------+----------+--------------+  PERO     Full                                                        +---------+---------------+---------+-----------+----------+--------------+   +---------+---------------+---------+-----------+----------+--------------+ LEFT     CompressibilityPhasicitySpontaneityPropertiesThrombus Aging +---------+---------------+---------+-----------+----------+--------------+ CFV      Full           Yes      Yes                                 +---------+---------------+---------+-----------+----------+--------------+ SFJ      Full                                                         +---------+---------------+---------+-----------+----------+--------------+ FV Prox  Full                                                        +---------+---------------+---------+-----------+----------+--------------+ FV Mid   Full                                                        +---------+---------------+---------+-----------+----------+--------------+ FV DistalFull                                                        +---------+---------------+---------+-----------+----------+--------------+ PFV      Full                                                        +---------+---------------+---------+-----------+----------+--------------+ POP      Full           Yes      Yes                                 +---------+---------------+---------+-----------+----------+--------------+ PTV      Full                                                        +---------+---------------+---------+-----------+----------+--------------+ PERO     Full                                                        +---------+---------------+---------+-----------+----------+--------------+  Summary: BILATERAL: - No evidence of deep vein thrombosis seen in the lower extremities, bilaterally. -No evidence of popliteal cyst, bilaterally.   *See table(s) above for measurements and observations. Electronically signed by Coral Else MD on 11/05/2022 at 8:52:41 PM.    Final    ECHOCARDIOGRAM COMPLETE BUBBLE STUDY  Result Date: 11/01/2022    ECHOCARDIOGRAM REPORT   Patient Name:   [name] Date of Exam: 11/01/2022 Medical Rec #:  [id number]      Height:       64.0 in Accession #:    [id number]     Weight:       128.0 lb Date of Birth:  Aug 22, 1961      BSA:          1.618 m Patient Age:    61 years       BP:           140/97 mmHg Patient Gender: F              HR:           72 bpm. Exam Location:  [facility] Procedure: 2D Echo, Cardiac Doppler, Color Doppler and Saline Contrast  Bubble            Study Indications:     Stroke 434.91 / I63.9  History:         Patient has no prior history of Echocardiogram examinations.                  Risk Factors:Hypertension. Alcohol and tobacco abuse.  Sonographer:     Cristela Blue Referring Phys:  [id number] Malachi Carl STACK Diagnosing Phys: Arnoldo Hooker MD  Sonographer Comments: Technically challenging study due to limited acoustic windows, suboptimal apical window and suboptimal parasternal window. IMPRESSIONS  1. Left ventricular ejection fraction, by estimation, is 55 to 60%. The left ventricle has normal function. The left ventricle has no regional wall motion abnormalities. Left ventricular diastolic parameters were normal.  2. Right ventricular systolic function is normal. The right ventricular size is normal.  3. The mitral valve is normal in structure. Trivial mitral valve regurgitation.  4. The aortic valve is normal in structure. Aortic valve regurgitation is not visualized.  5. Agitated saline contrast bubble study was negative, with no evidence of any interatrial shunt. FINDINGS  Left Ventricle: Left ventricular ejection fraction, by estimation, is 55 to 60%. The left ventricle has normal function. The left ventricle has no regional wall motion abnormalities. The left ventricular internal cavity size was normal in size. There is  no left ventricular hypertrophy. Left ventricular diastolic parameters were normal. Right Ventricle: The right ventricular size is normal. No increase in right ventricular wall thickness. Right ventricular systolic function is normal. Left Atrium: Left atrial size was normal in size. Right Atrium: Right atrial size was normal in size. Pericardium: There is no evidence of pericardial effusion. Mitral Valve: The mitral valve is normal in structure. Trivial mitral valve regurgitation. Tricuspid Valve: The tricuspid valve is normal in structure. Tricuspid valve regurgitation is trivial. Aortic Valve: The aortic valve is  normal in structure. Aortic valve regurgitation is not visualized. Aortic valve mean gradient measures 3.0 mmHg. Aortic valve peak gradient measures 5.5 mmHg. Aortic valve area, by VTI measures 2.62 cm. Pulmonic Valve: The pulmonic valve was normal in structure. Pulmonic valve regurgitation is trivial. Aorta: The aortic root and ascending aorta are structurally normal, with no evidence of dilitation. IAS/Shunts: No atrial level shunt detected by color flow Doppler.  Agitated saline contrast was given intravenously to evaluate for intracardiac shunting. Agitated saline contrast bubble study was negative, with no evidence of any interatrial shunt.  LEFT VENTRICLE PLAX 2D LVIDd:         3.70 cm   Diastology LVIDs:         2.70 cm   LV e' medial:    5.66 cm/s LV PW:         1.00 cm   LV E/e' medial:  11.8 LV IVS:        0.80 cm   LV e' lateral:   7.29 cm/s LVOT diam:     2.00 cm   LV E/e' lateral: 9.1 LV SV:         57 LV SV Index:   35 LVOT Area:     3.14 cm  RIGHT VENTRICLE RV Basal diam:  2.70 cm RV Mid diam:    2.20 cm RV S prime:     12.80 cm/s TAPSE (M-mode): 1.9 cm LEFT ATRIUM             Index        RIGHT ATRIUM          Index LA diam:        2.70 cm 1.67 cm/m   RA Area:     7.63 cm LA Vol (A2C):   24.7 ml 15.26 ml/m  RA Volume:   16.20 ml 10.01 ml/m LA Vol (A4C):   38.6 ml 23.85 ml/m LA Biplane Vol: 31.7 ml 19.59 ml/m  AORTIC VALVE AV Area (Vmax):    2.31 cm AV Area (Vmean):   2.22 cm AV Area (VTI):     2.62 cm AV Vmax:           117.00 cm/s AV Vmean:          79.200 cm/s AV VTI:            0.217 m AV Peak Grad:      5.5 mmHg AV Mean Grad:      3.0 mmHg LVOT Vmax:         86.20 cm/s LVOT Vmean:        56.000 cm/s LVOT VTI:          0.181 m LVOT/AV VTI ratio: 0.83  AORTA Ao Root diam: 2.87 cm MITRAL VALVE                TRICUSPID VALVE MV Area (PHT): 3.39 cm     TR Peak grad:   17.6 mmHg MV Decel Time: 224 msec     TR Vmax:        210.00 cm/s MV E velocity: 66.60 cm/s MV A velocity: 101.00 cm/s   SHUNTS MV E/A ratio:  0.66         Systemic VTI:  0.18 m                             Systemic Diam: 2.00 cm Arnoldo Hooker MD Electronically signed by Arnoldo Hooker MD Signature Date/Time: 11/01/2022/4:54:18 PM    Final    MR BRAIN WO CONTRAST  Result Date: 11/01/2022 CLINICAL DATA:  Stroke suspected. EXAM: MRI HEAD WITHOUT CONTRAST TECHNIQUE: Multiplanar, multiecho pulse sequences of the brain and surrounding structures were obtained without intravenous contrast. COMPARISON:  Same-day CT brain and head and neck angiogram. FINDINGS: Brain: There are multiple acute infarcts in the right MCA territory involving the right frontal lobe (series  5, image 25), right parietal lobe (series 5, image 37-34, 28, 25), in the posterior aspect of the right temporal lobe (series 5, image 20). There is an additional small punctate focus of acute infarct in the posteroinferior right occipital c lobe, which is likely in the right PCA territory (series 5, image 14). There is a chronic infarct in the high right parietal lobe (series 15, image 42). There is sequela of mild chronic microvascular ischemic change. There is a small amount of petechial hemorrhage associated with infarcts in the low right parietal lobe. No large parenchymal hematoma is visualized. Vascular: Normal flow voids. Skull and upper cervical spine: Normal marrow signal. Sinuses/Orbits: Negative. Other: None IMPRESSION: 1. Multiple acute infarcts in the right MCA territory involving the right frontal lobe, right parietal lobe, and posterior aspect of the right temporal lobe. Given findings on same-day CTA head/neck angiogram, these are favored to be embolic. 2. There is an additional small punctate focus of acute infarct in the right PCA territory involving the right occipital lobe. 3. Small amount of petechial hemorrhage associated with infarcts in the low right parietal lobe. No large parenchymal hematoma is visualized. Electronically Signed   By: Lorenza Cambridge  M.D.   On: 11/01/2022 12:54   CT ANGIO HEAD NECK W WO CM  Addendum Date: 11/01/2022   ADDENDUM REPORT: 11/01/2022 09:54 ADDENDUM: #1 was discussed by telephone with Dr. Pilar Jarvis on 11/01/2022 at 0937 hours. Electronically Signed   By: Odessa Fleming M.D.   On: 11/01/2022 09:54   Result Date: 11/01/2022 CLINICAL DATA:  61 year old female with left side numbness, tingling, paresthesia. EXAM: CT ANGIOGRAPHY HEAD AND NECK TECHNIQUE: Multidetector CT imaging of the head and neck was performed using the standard protocol during bolus administration of intravenous contrast. Multiplanar CT image reconstructions and MIPs were obtained to evaluate the vascular anatomy. Carotid stenosis measurements (when applicable) are obtained utilizing NASCET criteria, using the distal internal carotid diameter as the denominator. RADIATION DOSE REDUCTION: This exam was performed according to the departmental dose-optimization program which includes automated exposure control, adjustment of the mA and/or kV according to patient size and/or use of iterative reconstruction technique. CONTRAST:  50mL OMNIPAQUE IOHEXOL 350 MG/ML SOLN COMPARISON:  Plain head CT today 0837 hours. FINDINGS: CTA NECK Skeleton: Cervical spine degeneration with some bulky endplate changes. No acute osseous abnormality identified. Congenital incomplete ossification of the posterior C1 ring. Upper chest: Asymmetric centrilobular emphysema and scarring in the right upper lobe. Negative visible superior mediastinum. Other neck: No acute finding. Aortic arch: 3 vessel arch configuration. Mild arch tortuosity. Minimal arch atherosclerosis. Right carotid system: Mildly tortuous brachiocephalic artery with some soft plaque but no stenosis. Tortuous proximal right CCA. Minimal right CCA plaque. But at the right ICA origin there is fairly bulky soft and atherosclerotic plaque with evidence of adherent thrombus and thrombus located centrally within the right ICA bulb. See  series 4, image 94 and series 10, image 46. Subsequent high-grade stenosis. But the right ICA remains patent to the skull base. Left carotid system: Mildly tortuous left CCA with no significant plaque or stenosis. Soft plaque at the posterior left ICA origin and calcified plaque at the distal right ICA bulb. Less than 50 % stenosis with respect to the distal vessel. Tortuous left ICA below the skull base. Vertebral arteries: Soft plaque at the right subclavian artery origin without stenosis. Normal right vertebral artery origin. Right vertebral artery appears patent and dominant to the skull base without stenosis. Proximal left subclavian artery  soft plaque without stenosis. Soft plaque and moderate or severe stenosis at the left vertebral artery origin on series 8, image 118. The left vertebral is non dominant, diminutive but patent to the skull base. There is subtle asymmetric decreased enhancement of the left vertebral compared to the right. CTA HEAD Posterior circulation: Relatively dominant although normal caliber right vertebral V4 segment is patent to the basilar without stenosis. Patent right PICA origin. Patent but asymmetrically decreased enhancement of the left vertebral V4 segment, which is diminutive beyond the left PICA which remains patent. Small contribution to the vertebrobasilar junction. Patent although diminutive Basilar artery. Patent SCA and left PCA origins. Fetal type right PCA origin. Diminutive or absent left posterior communicating artery. Bilateral PCA branches are patent with no definite stenosis. Anterior circulation: Both ICA siphons are patent with fairly symmetric enhancement. Mild to moderate bilateral siphon calcified plaque with no significant stenosis. Normal right posterior communicating artery origin. Patent carotid termini. Patent MCA and ACA origins. Dominant left and diminutive right ACA A1 segments. Anterior communicating artery is also diminutive. Left ACA A2 somewhat  dominant, but bilateral ACA branches appear patent without stenosis. Left MCA M1 segment and trifurcation are patent without stenosis. Left MCA branches are within normal limits. Right MCA M1 segment and bifurcation are patent without stenosis. Right MCA branches appear within normal limits, no convincing branch occlusion. Venous sinuses: Early contrast timing, not well evaluated. Anatomic variants: Fetal type right PCA origin. Dominant appearing right vertebral artery, although may beyond the basis of left vertebral origin stenosis. Dominant left and diminutive right ACA A1 segments. Review of the MIP images confirms the above findings IMPRESSION: 1. 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 But the Right ICA and anterior circulation remain patent. No superimposed large vessel occlusion. 2. Positive also for moderate to severe atherosclerotic stenosis at the Left Vertebral Artery origin, with evidence of asymmetrically decreased flow in the left vertebral compared to the right. 3. Proximal right ICA and bilateral ICA siphon atherosclerosis but no other significant arterial stenosis in the head or neck. 4.  Emphysema (ICD10-J43.9). Electronically Signed: By: Odessa Fleming M.D. On: 11/01/2022 09:35   CT HEAD WO CONTRAST  Result Date: 11/01/2022 CLINICAL DATA:  LEFT side numbness and tingling, paresthesias, LEFT arm heaviness, symptoms for last few weeks EXAM: CT HEAD WITHOUT CONTRAST TECHNIQUE: Contiguous axial images were obtained from the base of the skull through the vertex without intravenous contrast. RADIATION DOSE REDUCTION: This exam was performed according to the departmental dose-optimization program which includes automated exposure control, adjustment of the mA and/or kV according to patient size and/or use of iterative reconstruction technique. COMPARISON:  04/07/2006 FINDINGS: Brain: Generalized  atrophy. Normal ventricular morphology. No midline shift or mass effect. Small old cortical infarct high RIGHT parietal region. Additional likely subacute infarct posterior RIGHT parietal region. Minor small vessel chronic ischemic changes of deep cerebral white matter. No intracranial hemorrhage, mass lesion, or definite acute infarction. No extra-axial fluid collections. Vascular: No hyperdense vessels. Minimal atherosclerotic calcifications of internal carotid arteries at skull base Skull: Intact Sinuses/Orbits: Clear Other: N/A IMPRESSION: Atrophy with small vessel chronic ischemic changes of deep cerebral white matter. Small old cortical infarct high RIGHT parietal region. Additional likely subacute infarct posterior RIGHT parietal region; this could be better assessed by MR. No acute intracranial abnormalities. Electronically Signed   By: Ulyses Southward M.D.   On: 11/01/2022 08:43  PHYSICAL EXAM  Temp:  [97.9 F (36.6 C)-98.6 F (37 C)] 98.6 F (37 C) (11/26 1212) Pulse Rate:  [83-102] 88 (11/26 1212) Resp:  [15-18] 18 (11/26 1212) BP: (103-148)/(54-90) 119/74 (11/26 1212) SpO2:  [98 %-100 %] 100 % (11/26 1212)  General - Well nourished, well developed, in no apparent distress.  Ophthalmologic - fundi not visualized due to noncooperation.  Cardiovascular - Regular rhythm and rate.  Mental Status -  Level of arousal and orientation to time, place, and person were intact. Language including expression, naming, repetition, comprehension was assessed and found intact. Fund of Knowledge was assessed and was intact.  Cranial Nerves II - XII - II - Visual field intact OU. III, IV, VI - Extraocular movements intact. V - Facial sensation intact bilaterally. VII - Facial movement intact bilaterally. VIII - Hearing & vestibular intact bilaterally. X - Palate elevates symmetrically. XI - Chin turning & shoulder shrug intact bilaterally. XII - Tongue protrusion intact.  Motor Strength -  The patient's strength was normal in all extremities except left UE bicep and tricep 4/5 and finger grip 4+/5 with chronic dupuytren contracture.  Bulk was normal and fasciculations were absent.    Motor Tone - Muscle tone was assessed at the neck and appendages and was normal.  Reflexes - The patient's reflexes were symmetrical in all extremities and she had no pathological reflexes.  Sensory - Light touch, temperature/pinprick were assessed and were symmetrical except left UE mildly decreased light touch sensation.    Coordination - The patient had normal movements in the hands and feet with no ataxia or dysmetria but slow with left FTN.  Tremor was absent.  Gait and Station - deferred.   ASSESSMENT/PLAN [name] is a 61 y.o. female with history of smoker, alcohol abuse, hypertension admitted for left hand weakness and numbness for 3 weeks. No tPA given due to outside window.    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 TEE pending tomorrow.  If negative, may consider loop recorder once patient off Eliquis. LDL 137 HgbA1c 5.0 UDS positive for THC Hypercoagulable work-up pending  Heparin IV for VTE prophylaxis No antithrombotic prior to admission, now on Eliquis.  Continue on discharge. Patient counseled to be compliant with her antithrombotic medications Ongoing aggressive stroke risk factor management Therapy recommendations: Outpatient OT Disposition: Pending  Hypertension Stable Long term BP goal normotensive  Hyperlipidemia Home meds: None LDL 137, goal < 70 Now on Lipitor 80 Continue statin at discharge  Tobacco abuse Current smoker Smoking cessation counseling  provided Nicotine patch provided Pt is willing to quit  Alcohol abuse On CIWA protocol B1/FA/multivitamin Alcohol limitation education provided  Other Stroke Risk Factors Substance abuse, UDS positive for THC.  THC cessation education provided.  Other Active Problems   Hospital day # 4    Marvel Plan, MD PhD Stroke Neurology 11/06/2022 7:39 PM    To contact Stroke Continuity provider, please refer to [email or web address]. After hours, contact General Neurology

## 2022-11-06 NOTE — Plan of Care (Signed)
  Problem: Education: Goal: Knowledge of disease or condition will improve Outcome: Progressing   Problem: Coping: Goal: Will verbalize positive feelings about self Outcome: Progressing   

## 2022-11-06 NOTE — Discharge Instructions (Signed)
Information on my medicine - ELIQUIS® (apixaban) ° °This medication education was reviewed with me or my healthcare representative as part of my discharge preparation.   ° ° °Why was Eliquis® prescribed for you? °Eliquis® was prescribed for you to reduce the risk of a blood clot forming that can cause a stroke. ° °What do You need to know about Eliquis® ? °Take your Eliquis® TWICE DAILY - one tablet in the morning and one tablet in the evening with or without food. If you have difficulty swallowing the tablet whole please discuss with your pharmacist how to take the medication safely. ° °Take Eliquis® exactly as prescribed by your doctor and DO NOT stop taking Eliquis® without talking to the doctor who prescribed the medication.  Stopping may increase your risk of developing a stroke.  Refill your prescription before you run out. ° °After discharge, you should have regular check-up appointments with your healthcare provider that is prescribing your Eliquis®.  In the future your dose may need to be changed if your kidney function or weight changes by a significant amount or as you get older. ° °What do you do if you miss a dose? °If you miss a dose, take it as soon as you remember on the same day and resume taking twice daily.  Do not take more than one dose of ELIQUIS at the same time to make up a missed dose. ° °Important Safety Information °A possible side effect of Eliquis® is bleeding. You should call your healthcare provider right away if you experience any of the following: °Bleeding from an injury or your nose that does not stop. °Unusual colored urine (red or dark brown) or unusual colored stools (red or black). °Unusual bruising for unknown reasons. °A serious fall or if you hit your head (even if there is no bleeding). ° °Some medicines may interact with Eliquis® and might increase your risk of bleeding or clotting while on Eliquis®. To help avoid this, consult your healthcare provider or pharmacist prior  to using any new prescription or non-prescription medications, including herbals, vitamins, non-steroidal anti-inflammatory drugs (NSAIDs) and supplements. ° °This website has more information on Eliquis® (apixaban): http://www.eliquis.com/eliquis/home  °

## 2022-11-07 ENCOUNTER — Other Ambulatory Visit (HOSPITAL_COMMUNITY): Payer: Self-pay

## 2022-11-07 DIAGNOSIS — I63239 Cerebral infarction due to unspecified occlusion or stenosis of unspecified carotid arteries: Secondary | ICD-10-CM

## 2022-11-07 MED ORDER — THIAMINE HCL 100 MG PO TABS
100.0000 mg | ORAL_TABLET | Freq: Every day | ORAL | 0 refills | Status: AC
  Filled 2022-11-07: qty 30, 30d supply, fill #0

## 2022-11-07 MED ORDER — AMLODIPINE BESYLATE 10 MG PO TABS
10.0000 mg | ORAL_TABLET | Freq: Every day | ORAL | 0 refills | Status: AC
  Filled 2022-11-07: qty 30, 30d supply, fill #0

## 2022-11-07 MED ORDER — APIXABAN 5 MG PO TABS
5.0000 mg | ORAL_TABLET | Freq: Two times a day (BID) | ORAL | 1 refills | Status: AC
  Filled 2022-11-07: qty 60, 30d supply, fill #0

## 2022-11-07 MED ORDER — FOLIC ACID 1 MG PO TABS
1.0000 mg | ORAL_TABLET | Freq: Every day | ORAL | 0 refills | Status: AC
  Filled 2022-11-07: qty 30, 30d supply, fill #0

## 2022-11-07 MED ORDER — ATORVASTATIN CALCIUM 80 MG PO TABS
80.0000 mg | ORAL_TABLET | Freq: Every day | ORAL | 0 refills | Status: AC
  Filled 2022-11-07: qty 30, 30d supply, fill #0

## 2022-11-07 MED ORDER — NICOTINE POLACRILEX 2 MG MT GUM
2.0000 mg | CHEWING_GUM | OROMUCOSAL | 0 refills | Status: AC | PRN
  Filled 2022-11-07: qty 100, fill #0

## 2022-11-07 NOTE — Progress Notes (Incomplete)
STROKE TEAM PROGRESS NOTE   SUBJECTIVE (INTERVAL HISTORY) ***  OBJECTIVE Temp:  [97.9 F (36.6 C)-98.6 F (37 C)] 98.1 F (36.7 C) (11/27 0328) Pulse Rate:  [74-94] 74 (11/27 0328) Cardiac Rhythm: Normal sinus rhythm (11/27 0700) Resp:  [14-20] 14 (11/27 0328) BP: (109-148)/(74-90) 109/80 (11/27 0328) SpO2:  [98 %-100 %] 98 % (11/27 0328)  No results for input(s): "GLUCAP" in the last 168 hours. Recent Labs  Lab 11/01/22 0829 11/02/22 0529 11/03/22 0242 11/04/22 0735 11/05/22 0327  NA 140 137 138 135 139  K 3.9 3.3* 4.0 3.2* 3.9  CL 109 104 102 102 106  CO2 24 23 23 22  21*  GLUCOSE 84 98 108* 102* 111*  BUN 8 7 5* 6 <5*  CREATININE 0.59 0.69 0.77 0.64 0.54  CALCIUM 9.0 9.0 9.5 9.0 9.9  MG  --   --  2.2  --   --   PHOS  --   --  4.0  --   --     Recent Labs  Lab 11/01/22 0829 11/02/22 0529  AST 42* 31  ALT 30 27  ALKPHOS 94 82  BILITOT 0.5 1.0  PROT 7.7 6.5  ALBUMIN 4.0 3.4*    Recent Labs  Lab 11/01/22 0829 11/02/22 0529 11/03/22 0242 11/04/22 0239 11/05/22 0327  WBC 4.7 7.2 7.1 8.3 7.2  NEUTROABS 2.0  --   --   --   --   HGB 15.4* 14.3 14.6 14.2 14.1  HCT 45.1 41.4 43.1 42.0 40.6  MCV 97.8 97.0 99.3 98.8 97.6  PLT 272 230 245 244 226    No results for input(s): "CKTOTAL", "CKMB", "CKMBINDEX", "TROPONINI" in the last 168 hours. No results for input(s): "LABPROT", "INR" in the last 72 hours. No results for input(s): "COLORURINE", "LABSPEC", "PHURINE", "GLUCOSEU", "HGBUR", "BILIRUBINUR", "KETONESUR", "PROTEINUR", "UROBILINOGEN", "NITRITE", "LEUKOCYTESUR" in the last 72 hours.  Invalid input(s): "APPERANCEUR"     Component Value Date/Time   CHOL 218 (H) 11/02/2022 0529   TRIG 69 11/02/2022 0529   HDL 67 11/02/2022 0529   CHOLHDL 3.3 11/02/2022 0529   VLDL 14 11/02/2022 0529   LDLCALC 137 (H) 11/02/2022 0529   Lab Results  Component Value Date   HGBA1C 5.0 11/01/2022      Component Value Date/Time   LABOPIA NONE DETECTED 11/01/2022 0852    LABOPIA NONE DETECTED 06/17/2007 0110   COCAINSCRNUR NONE DETECTED 11/01/2022 0852   LABBENZ NONE DETECTED 11/01/2022 0852   LABBENZ NONE DETECTED 06/17/2007 0110   AMPHETMU NONE DETECTED 11/01/2022 0852   AMPHETMU NONE DETECTED 06/17/2007 0110   THCU NONE DETECTED 11/01/2022 0852   THCU NONE DETECTED 06/17/2007 0110   LABBARB NONE DETECTED 11/01/2022 0852   LABBARB  06/17/2007 0110    NONE DETECTED        DRUG SCREEN FOR MEDICAL PURPOSES ONLY.  IF CONFIRMATION IS NEEDED FOR ANY PURPOSE, NOTIFY LAB WITHIN 5 DAYS.    Recent Labs  Lab 11/01/22 0829  ETH 47*     I have personally reviewed the radiological images below and agree with the radiology interpretations.  VAS 11/03/22 LOWER EXTREMITY VENOUS (DVT)  Result Date: 11/05/2022  Lower Venous DVT Study Patient Name:  [name]  Date of Exam:   11/05/2022 Medical Rec #: 11/07/2022       Accession #:    [id number] Date of Birth: 08/29/1961       Patient Gender: F Patient Age:   61 years Exam Location:  [facility] Procedure:  VAS [place] LOWER EXTREMITY VENOUS (DVT) Referring Phys: Scheryl Marten XU --------------------------------------------------------------------------------  Indications: Stroke.  Comparison Study: No prior study on file Performing Technologist: Sherren Kerns RVS  Examination Guidelines: A complete evaluation includes B-mode imaging, spectral Doppler, color Doppler, and power Doppler as needed of all accessible portions of each vessel. Bilateral testing is considered an integral part of a complete examination. Limited examinations for reoccurring indications may be performed as noted. The reflux portion of the exam is performed with the patient in reverse Trendelenburg.  +---------+---------------+---------+-----------+----------+--------------+ RIGHT    CompressibilityPhasicitySpontaneityPropertiesThrombus Aging +---------+---------------+---------+-----------+----------+--------------+ CFV      Full            Yes      Yes                                 +---------+---------------+---------+-----------+----------+--------------+ SFJ      Full                                                        +---------+---------------+---------+-----------+----------+--------------+ FV Prox  Full                                                        +---------+---------------+---------+-----------+----------+--------------+ FV Mid   Full                                                        +---------+---------------+---------+-----------+----------+--------------+ FV DistalFull                                                        +---------+---------------+---------+-----------+----------+--------------+ PFV      Full                                                        +---------+---------------+---------+-----------+----------+--------------+ POP      Full           Yes      Yes                                 +---------+---------------+---------+-----------+----------+--------------+ PTV      Full                                                        +---------+---------------+---------+-----------+----------+--------------+ PERO     Full                                                        +---------+---------------+---------+-----------+----------+--------------+   +---------+---------------+---------+-----------+----------+--------------+  LEFT     CompressibilityPhasicitySpontaneityPropertiesThrombus Aging +---------+---------------+---------+-----------+----------+--------------+ CFV      Full           Yes      Yes                                 +---------+---------------+---------+-----------+----------+--------------+ SFJ      Full                                                        +---------+---------------+---------+-----------+----------+--------------+ FV Prox  Full                                                         +---------+---------------+---------+-----------+----------+--------------+ FV Mid   Full                                                        +---------+---------------+---------+-----------+----------+--------------+ FV DistalFull                                                        +---------+---------------+---------+-----------+----------+--------------+ PFV      Full                                                        +---------+---------------+---------+-----------+----------+--------------+ POP      Full           Yes      Yes                                 +---------+---------------+---------+-----------+----------+--------------+ PTV      Full                                                        +---------+---------------+---------+-----------+----------+--------------+ PERO     Full                                                        +---------+---------------+---------+-----------+----------+--------------+     Summary: BILATERAL: - No evidence of deep vein thrombosis seen in the lower extremities, bilaterally. -No evidence of popliteal cyst, bilaterally.   *See table(s) above for measurements and observations. Electronically signed by Coral Else MD on 11/05/2022 at 8:52:41 PM.  Final    ECHOCARDIOGRAM COMPLETE BUBBLE STUDY  Result Date: 11/01/2022    ECHOCARDIOGRAM REPORT   Patient Name:   [name] Date of Exam: 11/01/2022 Medical Rec #:  [id number]      Height:       64.0 in Accession #:    [id number]     Weight:       128.0 lb Date of Birth:  [birth year]      BSA:          1.618 m Patient Age:    61 years       BP:           140/97 mmHg Patient Gender: F              HR:           72 bpm. Exam Location:  [facility] Procedure: 2D Echo, Cardiac Doppler, Color Doppler and Saline Contrast Bubble            Study Indications:     Stroke 434.91 / I63.9  History:         Patient has no prior history of Echocardiogram examinations.                   Risk Factors:Hypertension. Alcohol and tobacco abuse.  Sonographer:     Cristela BlueJerry Hege Referring Phys:  [id number] Malachi CarlOLLEEN M STACK Diagnosing Phys: Arnoldo HookerBruce Kowalski MD  Sonographer Comments: Technically challenging study due to limited acoustic windows, suboptimal apical window and suboptimal parasternal window. IMPRESSIONS  1. Left ventricular ejection fraction, by estimation, is 55 to 60%. The left ventricle has normal function. The left ventricle has no regional wall motion abnormalities. Left ventricular diastolic parameters were normal.  2. Right ventricular systolic function is normal. The right ventricular size is normal.  3. The mitral valve is normal in structure. Trivial mitral valve regurgitation.  4. The aortic valve is normal in structure. Aortic valve regurgitation is not visualized.  5. Agitated saline contrast bubble study was negative, with no evidence of any interatrial shunt. FINDINGS  Left Ventricle: Left ventricular ejection fraction, by estimation, is 55 to 60%. The left ventricle has normal function. The left ventricle has no regional wall motion abnormalities. The left ventricular internal cavity size was normal in size. There is  no left ventricular hypertrophy. Left ventricular diastolic parameters were normal. Right Ventricle: The right ventricular size is normal. No increase in right ventricular wall thickness. Right ventricular systolic function is normal. Left Atrium: Left atrial size was normal in size. Right Atrium: Right atrial size was normal in size. Pericardium: There is no evidence of pericardial effusion. Mitral Valve: The mitral valve is normal in structure. Trivial mitral valve regurgitation. Tricuspid Valve: The tricuspid valve is normal in structure. Tricuspid valve regurgitation is trivial. Aortic Valve: The aortic valve is normal in structure. Aortic valve regurgitation is not visualized. Aortic valve mean gradient measures 3.0 mmHg. Aortic valve peak gradient measures 5.5 mmHg.  Aortic valve area, by VTI measures 2.62 cm. Pulmonic Valve: The pulmonic valve was normal in structure. Pulmonic valve regurgitation is trivial. Aorta: The aortic root and ascending aorta are structurally normal, with no evidence of dilitation. IAS/Shunts: No atrial level shunt detected by color flow Doppler. Agitated saline contrast was given intravenously to evaluate for intracardiac shunting. Agitated saline contrast bubble study was negative, with no evidence of any interatrial shunt.  LEFT VENTRICLE PLAX 2D LVIDd:         3.70 cm   Diastology  LVIDs:         2.70 cm   LV e' medial:    5.66 cm/s LV PW:         1.00 cm   LV E/e' medial:  11.8 LV IVS:        0.80 cm   LV e' lateral:   7.29 cm/s LVOT diam:     2.00 cm   LV E/e' lateral: 9.1 LV SV:         57 LV SV Index:   35 LVOT Area:     3.14 cm  RIGHT VENTRICLE RV Basal diam:  2.70 cm RV Mid diam:    2.20 cm RV S prime:     12.80 cm/s TAPSE (M-mode): 1.9 cm LEFT ATRIUM             Index        RIGHT ATRIUM          Index LA diam:        2.70 cm 1.67 cm/m   RA Area:     7.63 cm LA Vol (A2C):   24.7 ml 15.26 ml/m  RA Volume:   16.20 ml 10.01 ml/m LA Vol (A4C):   38.6 ml 23.85 ml/m LA Biplane Vol: 31.7 ml 19.59 ml/m  AORTIC VALVE AV Area (Vmax):    2.31 cm AV Area (Vmean):   2.22 cm AV Area (VTI):     2.62 cm AV Vmax:           117.00 cm/s AV Vmean:          79.200 cm/s AV VTI:            0.217 m AV Peak Grad:      5.5 mmHg AV Mean Grad:      3.0 mmHg LVOT Vmax:         86.20 cm/s LVOT Vmean:        56.000 cm/s LVOT VTI:          0.181 m LVOT/AV VTI ratio: 0.83  AORTA Ao Root diam: 2.87 cm MITRAL VALVE                TRICUSPID VALVE MV Area (PHT): 3.39 cm     TR Peak grad:   17.6 mmHg MV Decel Time: 224 msec     TR Vmax:        210.00 cm/s MV E velocity: 66.60 cm/s MV A velocity: 101.00 cm/s  SHUNTS MV E/A ratio:  0.66         Systemic VTI:  0.18 m                             Systemic Diam: 2.00 cm Arnoldo Hooker MD Electronically signed by Arnoldo Hooker MD Signature Date/Time: 11/01/2022/4:54:18 PM    Final    MR BRAIN WO CONTRAST  Result Date: 11/01/2022 CLINICAL DATA:  Stroke suspected. EXAM: MRI HEAD WITHOUT CONTRAST TECHNIQUE: Multiplanar, multiecho pulse sequences of the brain and surrounding structures were obtained without intravenous contrast. COMPARISON:  Same-day CT brain and head and neck angiogram. FINDINGS: Brain: There are multiple acute infarcts in the right MCA territory involving the right frontal lobe (series 5, image 25), right parietal lobe (series 5, image 37-34, 28, 25), in the posterior aspect of the right temporal lobe (series 5, image 20). There is an additional small punctate focus of acute infarct in the posteroinferior right occipital c lobe, which is  likely in the right PCA territory (series 5, image 14). There is a chronic infarct in the high right parietal lobe (series 15, image 42). There is sequela of mild chronic microvascular ischemic change. There is a small amount of petechial hemorrhage associated with infarcts in the low right parietal lobe. No large parenchymal hematoma is visualized. Vascular: Normal flow voids. Skull and upper cervical spine: Normal marrow signal. Sinuses/Orbits: Negative. Other: None IMPRESSION: 1. Multiple acute infarcts in the right MCA territory involving the right frontal lobe, right parietal lobe, and posterior aspect of the right temporal lobe. Given findings on same-day CTA head/neck angiogram, these are favored to be embolic. 2. There is an additional small punctate focus of acute infarct in the right PCA territory involving the right occipital lobe. 3. Small amount of petechial hemorrhage associated with infarcts in the low right parietal lobe. No large parenchymal hematoma is visualized. Electronically Signed   By: Lorenza Cambridge M.D.   On: 11/01/2022 12:54   CT ANGIO HEAD NECK W WO CM  Addendum Date: 11/01/2022   ADDENDUM REPORT: 11/01/2022 09:54 ADDENDUM: #1 was discussed by  telephone with Dr. Pilar Jarvis on 11/01/2022 at 0937 hours. Electronically Signed   By: Odessa Fleming M.D.   On: 11/01/2022 09:54   Result Date: 11/01/2022 CLINICAL DATA:  61 year old female with left side numbness, tingling, paresthesia. EXAM: CT ANGIOGRAPHY HEAD AND NECK TECHNIQUE: Multidetector CT imaging of the head and neck was performed using the standard protocol during bolus administration of intravenous contrast. Multiplanar CT image reconstructions and MIPs were obtained to evaluate the vascular anatomy. Carotid stenosis measurements (when applicable) are obtained utilizing NASCET criteria, using the distal internal carotid diameter as the denominator. RADIATION DOSE REDUCTION: This exam was performed according to the departmental dose-optimization program which includes automated exposure control, adjustment of the mA and/or kV according to patient size and/or use of iterative reconstruction technique. CONTRAST:  50mL OMNIPAQUE IOHEXOL 350 MG/ML SOLN COMPARISON:  Plain head CT today 0837 hours. FINDINGS: CTA NECK Skeleton: Cervical spine degeneration with some bulky endplate changes. No acute osseous abnormality identified. Congenital incomplete ossification of the posterior C1 ring. Upper chest: Asymmetric centrilobular emphysema and scarring in the right upper lobe. Negative visible superior mediastinum. Other neck: No acute finding. Aortic arch: 3 vessel arch configuration. Mild arch tortuosity. Minimal arch atherosclerosis. Right carotid system: Mildly tortuous brachiocephalic artery with some soft plaque but no stenosis. Tortuous proximal right CCA. Minimal right CCA plaque. But at the right ICA origin there is fairly bulky soft and atherosclerotic plaque with evidence of adherent thrombus and thrombus located centrally within the right ICA bulb. See series 4, image 94 and series 10, image 46. Subsequent high-grade stenosis. But the right ICA remains patent to the skull base. Left carotid system: Mildly  tortuous left CCA with no significant plaque or stenosis. Soft plaque at the posterior left ICA origin and calcified plaque at the distal right ICA bulb. Less than 50 % stenosis with respect to the distal vessel. Tortuous left ICA below the skull base. Vertebral arteries: Soft plaque at the right subclavian artery origin without stenosis. Normal right vertebral artery origin. Right vertebral artery appears patent and dominant to the skull base without stenosis. Proximal left subclavian artery soft plaque without stenosis. Soft plaque and moderate or severe stenosis at the left vertebral artery origin on series 8, image 118. The left vertebral is non dominant, diminutive but patent to the skull base. There is subtle asymmetric decreased enhancement of the left  vertebral compared to the right. CTA HEAD Posterior circulation: Relatively dominant although normal caliber right vertebral V4 segment is patent to the basilar without stenosis. Patent right PICA origin. Patent but asymmetrically decreased enhancement of the left vertebral V4 segment, which is diminutive beyond the left PICA which remains patent. Small contribution to the vertebrobasilar junction. Patent although diminutive Basilar artery. Patent SCA and left PCA origins. Fetal type right PCA origin. Diminutive or absent left posterior communicating artery. Bilateral PCA branches are patent with no definite stenosis. Anterior circulation: Both ICA siphons are patent with fairly symmetric enhancement. Mild to moderate bilateral siphon calcified plaque with no significant stenosis. Normal right posterior communicating artery origin. Patent carotid termini. Patent MCA and ACA origins. Dominant left and diminutive right ACA A1 segments. Anterior communicating artery is also diminutive. Left ACA A2 somewhat dominant, but bilateral ACA branches appear patent without stenosis. Left MCA M1 segment and trifurcation are patent without stenosis. Left MCA branches are  within normal limits. Right MCA M1 segment and bifurcation are patent without stenosis. Right MCA branches appear within normal limits, no convincing branch occlusion. Venous sinuses: Early contrast timing, not well evaluated. Anatomic variants: Fetal type right PCA origin. Dominant appearing right vertebral artery, although may beyond the basis of left vertebral origin stenosis. Dominant left and diminutive right ACA A1 segments. Review of the MIP images confirms the above findings IMPRESSION: 1. 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 But the Right ICA and anterior circulation remain patent. No superimposed large vessel occlusion. 2. Positive also for moderate to severe atherosclerotic stenosis at the Left Vertebral Artery origin, with evidence of asymmetrically decreased flow in the left vertebral compared to the right. 3. Proximal right ICA and bilateral ICA siphon atherosclerosis but no other significant arterial stenosis in the head or neck. 4.  Emphysema (ICD10-J43.9). Electronically Signed: By: Odessa Fleming M.D. On: 11/01/2022 09:35   CT HEAD WO CONTRAST  Result Date: 11/01/2022 CLINICAL DATA:  LEFT side numbness and tingling, paresthesias, LEFT arm heaviness, symptoms for last few weeks EXAM: CT HEAD WITHOUT CONTRAST TECHNIQUE: Contiguous axial images were obtained from the base of the skull through the vertex without intravenous contrast. RADIATION DOSE REDUCTION: This exam was performed according to the departmental dose-optimization program which includes automated exposure control, adjustment of the mA and/or kV according to patient size and/or use of iterative reconstruction technique. COMPARISON:  04/07/2006 FINDINGS: Brain: Generalized atrophy. Normal ventricular morphology. No midline shift or mass effect. Small old cortical infarct high RIGHT parietal region. Additional likely subacute  infarct posterior RIGHT parietal region. Minor small vessel chronic ischemic changes of deep cerebral white matter. No intracranial hemorrhage, mass lesion, or definite acute infarction. No extra-axial fluid collections. Vascular: No hyperdense vessels. Minimal atherosclerotic calcifications of internal carotid arteries at skull base Skull: Intact Sinuses/Orbits: Clear Other: N/A IMPRESSION: Atrophy with small vessel chronic ischemic changes of deep cerebral white matter. Small old cortical infarct high RIGHT parietal region. Additional likely subacute infarct posterior RIGHT parietal region; this could be better assessed by MR. No acute intracranial abnormalities. Electronically Signed   By: Ulyses Southward M.D.   On: 11/01/2022 08:43     PHYSICAL EXAM  Temp:  [97.9 F (36.6 C)-98.6 F (37 C)] 98.1 F (36.7 C) (11/27 0328) Pulse Rate:  [74-94] 74 (11/27 0328) Resp:  [14-20] 14 (11/27 0328) BP: (109-148)/(74-90) 109/80 (11/27 0328) SpO2:  [98 %-100 %] 98 % (  11/27 0328)  General - Well nourished, well developed, in no apparent distress.  Ophthalmologic - fundi not visualized due to noncooperation.  Cardiovascular - Regular rhythm and rate.  Mental Status -  Level of arousal and orientation to time, place, and person were intact. Language including expression, naming, repetition, comprehension was assessed and found intact. Fund of Knowledge was assessed and was intact.  Cranial Nerves II - XII - II - Visual field intact OU. III, IV, VI - Extraocular movements intact. V - Facial sensation intact bilaterally. VII - Facial movement intact bilaterally. VIII - Hearing & vestibular intact bilaterally. X - Palate elevates symmetrically. XI - Chin turning & shoulder shrug intact bilaterally. XII - Tongue protrusion intact.  Motor Strength - The patient's strength was normal in all extremities except left UE bicep and tricep 4/5 and finger grip 4+/5 with chronic dupuytren contracture.  Bulk was  normal and fasciculations were absent.    Motor Tone - Muscle tone was assessed at the neck and appendages and was normal.  Reflexes - The patient's reflexes were symmetrical in all extremities and she had no pathological reflexes.  Sensory - Light touch, temperature/pinprick were assessed and were symmetrical except left UE mildly decreased light touch sensation.    Coordination - The patient had normal movements in the hands and feet with no ataxia or dysmetria but slow with left FTN.  Tremor was absent.  Gait and Station - deferred.   ASSESSMENT/PLAN [name] is a 61 y.o. female with history of smoker, alcohol abuse, hypertension admitted for left hand weakness and numbness for 3 weeks. No tPA given due to outside window.    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 TEE pending tomorrow.  If negative, may consider loop recorder once patient off Eliquis. LDL 137 HgbA1c 5.0 UDS positive for THC Hypercoagulable work-up pending  Heparin IV for VTE prophylaxis No antithrombotic prior to admission, now on Eliquis.  Continue on discharge. Patient counseled to be compliant with her antithrombotic medications Ongoing aggressive stroke risk factor management Therapy recommendations: Outpatient OT Disposition: Pending  Hypertension Stable Long term BP goal normotensive  Hyperlipidemia Home meds: None LDL 137, goal < 70 Now on Lipitor 80 Continue statin at discharge  Tobacco abuse Current smoker Smoking cessation counseling provided Nicotine patch provided Pt is willing to quit  Alcohol abuse On CIWA protocol B1/FA/multivitamin Alcohol limitation education provided  Other  Stroke Risk Factors Substance abuse, UDS positive for THC.  THC cessation education provided.  Other Active Problems   Hospital day # 5  Lyndle Herrlich, MD  To contact Stroke Continuity provider, please refer to [email or web address]. After hours, contact General Neurology

## 2022-11-07 NOTE — Progress Notes (Signed)
Occupational Therapy Treatment Patient Details Name: [name] MRN: [id number] DOB: 06-Dec-1961 Today's Date: 11/07/2022   History of present illness 61 yo female admitted to [facility] on 11/01/22 for L sided weakness and numbness over several weeks. MRI (+) scattered R hemispheric CVA, R parietal region, CTA (+) R ICA thrombus PMH current smoker. Pt transferred to [facility] hospital overnight 11/21. 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   OT comments  Patient continues to make steady progress towards goals in skilled OT session. Patient's session encompassed education with regard to LUE splint and exercises. Patient's family not present, however patient stating that her daughter knows how to don her splint for her and patient able to verbally walk OT through steps of putting on splint. Education provided to patient with regard to passive stretch and LUE activites to complete to increase dexterity and fine motor coordination. Patient able to demonstrate, and handout provided at end of session. OT recommendation remains appropriate, will continue to follow.    Recommendations for follow up therapy are one component of a multi-disciplinary discharge planning process, led by the attending physician.  Recommendations may be updated based on patient status, additional functional criteria and insurance authorization.    Follow Up Recommendations  Outpatient OT     Assistance Recommended at Discharge PRN  Patient can return home with the following  Assist for transportation   Equipment Recommendations  None recommended by OT    Recommendations for Other Services      Precautions / Restrictions Precautions Precautions: Fall Precaution Comments: low fall, chronic R ankle weakness Restrictions Weight Bearing Restrictions: No       Mobility Bed Mobility Overal bed mobility: Independent                   Transfers Overall transfer level: Independent                       Balance Overall balance assessment: Independent                                         ADL either performed or assessed with clinical judgement   ADL                                         General ADL Comments: Session focus on LUE HEP    Extremity/Trunk Assessment              Vision       Perception     Praxis      Cognition Arousal/Alertness: Awake/alert Behavior During Therapy: WFL for tasks assessed/performed Overall Cognitive Status: Within Functional Limits for tasks assessed                                          Exercises Other Exercises Other Exercises: Patient's family not present, however patient stating that her daughter knows how to don her splint for her and patient able to verbally walk OT through steps of putting on splint. Education provided to patient with regard to passive stretch and LUE activites to complete to increase dexterity and  fine motor coordination. Patient able to demonstrate, and handout provided at end of session.    Shoulder Instructions       General Comments      Pertinent Vitals/ Pain       Pain Assessment Pain Assessment: No/denies pain  Home Living                                          Prior Functioning/Environment              Frequency  Min 2X/week        Progress Toward Goals  OT Goals(current goals can now be found in the care plan section)  Progress towards OT goals: Progressing toward goals  Acute Rehab OT Goals Patient Stated Goal: to get out of here OT Goal Formulation: With patient Time For Goal Achievement: 11/16/22 Potential to Achieve Goals: Good  Plan Discharge plan remains appropriate    Co-evaluation                 AM-PAC OT "6 Clicks" Daily Activity     Outcome Measure   Help from another person eating  meals?: None Help from another person taking care of personal grooming?: None Help from another person toileting, which includes using toliet, bedpan, or urinal?: None Help from another person bathing (including washing, rinsing, drying)?: None Help from another person to put on and taking off regular upper body clothing?: None Help from another person to put on and taking off regular lower body clothing?: None 6 Click Score: 24    End of Session Equipment Utilized During Treatment: Other (comment) [facility] handout for LUE)  OT Visit Diagnosis: Hemiplegia and hemiparesis;Other abnormalities of gait and mobility (R26.89) Hemiplegia - Right/Left: Left Hemiplegia - dominant/non-dominant: Non-Dominant Hemiplegia - caused by: Cerebral infarction   Activity Tolerance Patient tolerated treatment well   Patient Left in bed;with call bell/phone within reach   Nurse Communication Mobility status        Time: 1210-1221 OT Time Calculation (min): 11 min  Charges: OT General Charges $OT Visit: 1 Visit OT Treatments $Therapeutic Exercise: 8-22 mins  Corinne Ports E. Mica Releford, OTR/L Acute Rehabilitation Services [phone]   Ascencion Dike 11/07/2022, 12:50 PM

## 2022-11-07 NOTE — Discharge Summary (Cosign Needed)
Stroke Discharge Summary  Patient ID: [name]   MRN: [id number]      DOB: August 12, 1961  Date of Admission: 11/02/2022 Date of Discharge: 11/07/2022  Attending Physician:  Rosalin Hawking, MD PhD Consultant(s):     IR   Patient's PCP:  Pcp, No  DISCHARGE DIAGNOSIS:  Principal Problem:   Acute right MCA stroke ([place])   Allergies as of 11/07/2022   No Known Allergies      Medication List     TAKE these medications    acetaminophen 500 MG tablet Commonly known as: TYLENOL Take 1,500 mg by mouth 3 (three) times daily as needed for mild pain or moderate pain.   amLODipine 10 MG tablet Commonly known as: NORVASC Take 1 tablet (10 mg total) by mouth daily. Start taking on: November 08, 2022   atorvastatin 80 MG tablet Commonly known as: LIPITOR Take 1 tablet (80 mg total) by mouth daily. Start taking on: November 08, 2022   Eliquis 5 MG Tabs tablet Generic drug: apixaban Take 1 tablet (5 mg total) by mouth 2 (two) times daily.   folic acid 1 MG tablet Commonly known as: FOLVITE Take 1 tablet (1 mg total) by mouth daily. Start taking on: November 08, 2022   nicotine polacrilex 2 MG gum Commonly known as: NICORETTE Take 1 each (2 mg total) by mouth as needed for smoking cessation.   PARoxetine 10 MG tablet Commonly known as: Paxil Take one daily for one week and then increase to 2 daily.   thiamine 100 MG tablet Commonly known as: VITAMIN B1 Take 1 tablet (100 mg total) by mouth daily. Start taking on: November 08, 2022        LABORATORY STUDIES CBC    Component Value Date/Time   WBC 7.2 11/05/2022 0327   RBC 4.16 11/05/2022 0327   HGB 14.1 11/05/2022 0327   HCT 40.6 11/05/2022 0327   PLT 226 11/05/2022 0327   MCV 97.6 11/05/2022 0327   MCH 33.9 11/05/2022 0327   MCHC 34.7 11/05/2022 0327   RDW 12.5 11/05/2022 0327   LYMPHSABS 2.0 11/01/2022 0829   MONOABS 0.4 11/01/2022 0829   EOSABS 0.3 11/01/2022 0829   BASOSABS 0.1 11/01/2022 0829    CMP    Component Value Date/Time   NA 139 11/05/2022 0327   K 3.9 11/05/2022 0327   CL 106 11/05/2022 0327   CO2 21 (L) 11/05/2022 0327   GLUCOSE 111 (H) 11/05/2022 0327   BUN <5 (L) 11/05/2022 0327   CREATININE 0.54 11/05/2022 0327   CALCIUM 9.9 11/05/2022 0327   PROT 6.5 11/02/2022 0529   ALBUMIN 3.4 (L) 11/02/2022 0529   AST 31 11/02/2022 0529   ALT 27 11/02/2022 0529   ALKPHOS 82 11/02/2022 0529   BILITOT 1.0 11/02/2022 0529   GFRNONAA >60 11/05/2022 0327   GFRAA  08/18/2009 0805    >60        The eGFR has been calculated using the MDRD equation. This calculation has not been validated in all clinical situations. eGFR's persistently <60 mL/min signify possible Chronic Kidney Disease.   COAGS Lab Results  Component Value Date   INR 1.0 11/01/2022   Lipid Panel    Component Value Date/Time   CHOL 218 (H) 11/02/2022 0529   TRIG 69 11/02/2022 0529   HDL 67 11/02/2022 0529   CHOLHDL 3.3 11/02/2022 0529   VLDL 14 11/02/2022 0529   LDLCALC 137 (H) 11/02/2022 0529   HgbA1C  Lab  Results  Component Value Date   HGBA1C 5.0 11/01/2022   Urinalysis    Component Value Date/Time   COLORURINE YELLOW (A) 11/01/2022 0852   APPEARANCEUR CLEAR (A) 11/01/2022 0852   LABSPEC 1.014 11/01/2022 0852   PHURINE 5.0 11/01/2022 0852   GLUCOSEU NEGATIVE 11/01/2022 0852   GLUCOSEU NEGATIVE 02/14/2020 1145   HGBUR NEGATIVE 11/01/2022 0852   BILIRUBINUR NEGATIVE 11/01/2022 0852   KETONESUR NEGATIVE 11/01/2022 0852   PROTEINUR NEGATIVE 11/01/2022 0852   UROBILINOGEN 0.2 02/14/2020 1145   NITRITE NEGATIVE 11/01/2022 0852   LEUKOCYTESUR NEGATIVE 11/01/2022 0852   Urine Drug Screen     Component Value Date/Time   LABOPIA NONE DETECTED 11/01/2022 0852   LABOPIA NONE DETECTED 06/17/2007 0110   COCAINSCRNUR NONE DETECTED 11/01/2022 0852   LABBENZ NONE DETECTED 11/01/2022 0852   LABBENZ NONE DETECTED 06/17/2007 0110   AMPHETMU NONE DETECTED 11/01/2022 0852   AMPHETMU NONE  DETECTED 06/17/2007 0110   THCU NONE DETECTED 11/01/2022 0852   THCU NONE DETECTED 06/17/2007 0110   LABBARB NONE DETECTED 11/01/2022 0852   LABBARB  06/17/2007 0110    NONE DETECTED        DRUG SCREEN FOR MEDICAL PURPOSES ONLY.  IF CONFIRMATION IS NEEDED FOR ANY PURPOSE, NOTIFY LAB WITHIN 5 DAYS.    Alcohol Level    Component Value Date/Time   ETH 47 (H) 11/01/2022 0829     SIGNIFICANT DIAGNOSTIC STUDIES VAS [place] LOWER EXTREMITY VENOUS (DVT)  Result Date: 11/05/2022  Lower Venous DVT Study Patient Name:  [name]  Date of Exam:   11/05/2022 Medical Rec #: [id number]       Accession #:    [id number] Date of Birth: 08/09/1961       Patient Gender: F Patient Age:   35 years Exam Location:  [facility] Procedure:      VAS [place] LOWER EXTREMITY VENOUS (DVT) Referring Phys: Cornelius Moras XU --------------------------------------------------------------------------------  Indications: Stroke.  Comparison Study: No prior study on file Performing Technologist: Sharion Dove RVS  Examination Guidelines: A complete evaluation includes B-mode imaging, spectral Doppler, color Doppler, and power Doppler as needed of all accessible portions of each vessel. Bilateral testing is considered an integral part of a complete examination. Limited examinations for reoccurring indications may be performed as noted. The reflux portion of the exam is performed with the patient in reverse Trendelenburg.  +---------+---------------+---------+-----------+----------+--------------+ RIGHT    CompressibilityPhasicitySpontaneityPropertiesThrombus Aging +---------+---------------+---------+-----------+----------+--------------+ CFV      Full           Yes      Yes                                 +---------+---------------+---------+-----------+----------+--------------+ SFJ      Full                                                         +---------+---------------+---------+-----------+----------+--------------+ FV Prox  Full                                                        +---------+---------------+---------+-----------+----------+--------------+ FV Mid  Full                                                        +---------+---------------+---------+-----------+----------+--------------+ FV DistalFull                                                        +---------+---------------+---------+-----------+----------+--------------+ PFV      Full                                                        +---------+---------------+---------+-----------+----------+--------------+ POP      Full           Yes      Yes                                 +---------+---------------+---------+-----------+----------+--------------+ PTV      Full                                                        +---------+---------------+---------+-----------+----------+--------------+ PERO     Full                                                        +---------+---------------+---------+-----------+----------+--------------+   +---------+---------------+---------+-----------+----------+--------------+ LEFT     CompressibilityPhasicitySpontaneityPropertiesThrombus Aging +---------+---------------+---------+-----------+----------+--------------+ CFV      Full           Yes      Yes                                 +---------+---------------+---------+-----------+----------+--------------+ SFJ      Full                                                        +---------+---------------+---------+-----------+----------+--------------+ FV Prox  Full                                                        +---------+---------------+---------+-----------+----------+--------------+ FV Mid   Full                                                         +---------+---------------+---------+-----------+----------+--------------+  FV DistalFull                                                        +---------+---------------+---------+-----------+----------+--------------+ PFV      Full                                                        +---------+---------------+---------+-----------+----------+--------------+ POP      Full           Yes      Yes                                 +---------+---------------+---------+-----------+----------+--------------+ PTV      Full                                                        +---------+---------------+---------+-----------+----------+--------------+ PERO     Full                                                        +---------+---------------+---------+-----------+----------+--------------+     Summary: BILATERAL: - No evidence of deep vein thrombosis seen in the lower extremities, bilaterally. -No evidence of popliteal cyst, bilaterally.   *See table(s) above for measurements and observations. Electronically signed by Harold Barban MD on 11/05/2022 at 8:52:41 PM.    Final    ECHOCARDIOGRAM COMPLETE BUBBLE STUDY  Result Date: 11/01/2022    ECHOCARDIOGRAM REPORT   Patient Name:   GREGG HOLSTER Date of Exam: 11/01/2022 Medical Rec #:  [id number]      Height:       64.0 in Accession #:    [id number]     Weight:       128.0 lb Date of Birth:  [birth year]      BSA:          1.618 m Patient Age:    80 years       BP:           140/97 mmHg Patient Gender: F              HR:           72 bpm. Exam Location:  [facility] Procedure: 2D Echo, Cardiac Doppler, Color Doppler and Saline Contrast Bubble            Study Indications:     Stroke 434.91 / I63.9  History:         Patient has no prior history of Echocardiogram examinations.                  Risk Factors:Hypertension. Alcohol and tobacco abuse.  Sonographer:     Sherrie Sport Referring Phys:  [place] Diagnosing Phys: Serafina Royals MD  Sonographer Comments: Technically challenging study  due to limited acoustic windows, suboptimal apical window and suboptimal parasternal window. IMPRESSIONS  1. Left ventricular ejection fraction, by estimation, is 55 to 60%. The left ventricle has normal function. The left ventricle has no regional wall motion abnormalities. Left ventricular diastolic parameters were normal.  2. Right ventricular systolic function is normal. The right ventricular size is normal.  3. The mitral valve is normal in structure. Trivial mitral valve regurgitation.  4. The aortic valve is normal in structure. Aortic valve regurgitation is not visualized.  5. Agitated saline contrast bubble study was negative, with no evidence of any interatrial shunt. FINDINGS  Left Ventricle: Left ventricular ejection fraction, by estimation, is 55 to 60%. The left ventricle has normal function. The left ventricle has no regional wall motion abnormalities. The left ventricular internal cavity size was normal in size. There is  no left ventricular hypertrophy. Left ventricular diastolic parameters were normal. Right Ventricle: The right ventricular size is normal. No increase in right ventricular wall thickness. Right ventricular systolic function is normal. Left Atrium: Left atrial size was normal in size. Right Atrium: Right atrial size was normal in size. Pericardium: There is no evidence of pericardial effusion. Mitral Valve: The mitral valve is normal in structure. Trivial mitral valve regurgitation. Tricuspid Valve: The tricuspid valve is normal in structure. Tricuspid valve regurgitation is trivial. Aortic Valve: The aortic valve is normal in structure. Aortic valve regurgitation is not visualized. Aortic valve mean gradient measures 3.0 mmHg. Aortic valve peak gradient measures 5.5 mmHg. Aortic valve area, by VTI measures 2.62 cm. Pulmonic Valve: The pulmonic valve was normal in structure. Pulmonic valve regurgitation is trivial.  Aorta: The aortic root and ascending aorta are structurally normal, with no evidence of dilitation. IAS/Shunts: No atrial level shunt detected by color flow Doppler. Agitated saline contrast was given intravenously to evaluate for intracardiac shunting. Agitated saline contrast bubble study was negative, with no evidence of any interatrial shunt.  LEFT VENTRICLE PLAX 2D LVIDd:         3.70 cm   Diastology LVIDs:         2.70 cm   LV e' medial:    5.66 cm/s LV PW:         1.00 cm   LV E/e' medial:  11.8 LV IVS:        0.80 cm   LV e' lateral:   7.29 cm/s LVOT diam:     2.00 cm   LV E/e' lateral: 9.1 LV SV:         57 LV SV Index:   35 LVOT Area:     3.14 cm  RIGHT VENTRICLE RV Basal diam:  2.70 cm RV Mid diam:    2.20 cm RV S prime:     12.80 cm/s TAPSE (M-mode): 1.9 cm LEFT ATRIUM             Index        RIGHT ATRIUM          Index LA diam:        2.70 cm 1.67 cm/m   RA Area:     7.63 cm LA Vol (A2C):   24.7 ml 15.26 ml/m  RA Volume:   16.20 ml 10.01 ml/m LA Vol (A4C):   38.6 ml 23.85 ml/m LA Biplane Vol: 31.7 ml 19.59 ml/m  AORTIC VALVE AV Area (Vmax):    2.31 cm AV Area (Vmean):   2.22 cm AV Area (VTI):     2.62 cm AV Vmax:  117.00 cm/s AV Vmean:          79.200 cm/s AV VTI:            0.217 m AV Peak Grad:      5.5 mmHg AV Mean Grad:      3.0 mmHg LVOT Vmax:         86.20 cm/s LVOT Vmean:        56.000 cm/s LVOT VTI:          0.181 m LVOT/AV VTI ratio: 0.83  AORTA Ao Root diam: 2.87 cm MITRAL VALVE                TRICUSPID VALVE MV Area (PHT): 3.39 cm     TR Peak grad:   17.6 mmHg MV Decel Time: 224 msec     TR Vmax:        210.00 cm/s MV E velocity: 66.60 cm/s MV A velocity: 101.00 cm/s  SHUNTS MV E/A ratio:  0.66         Systemic VTI:  0.18 m                             Systemic Diam: 2.00 cm Serafina Royals MD Electronically signed by Serafina Royals MD Signature Date/Time: 11/01/2022/4:54:18 PM    Final    MR BRAIN WO CONTRAST  Result Date: 11/01/2022 CLINICAL DATA:  Stroke suspected.  EXAM: MRI HEAD WITHOUT CONTRAST TECHNIQUE: Multiplanar, multiecho pulse sequences of the brain and surrounding structures were obtained without intravenous contrast. COMPARISON:  Same-day CT brain and head and neck angiogram. FINDINGS: Brain: There are multiple acute infarcts in the right MCA territory involving the right frontal lobe (series 5, image 25), right parietal lobe (series 5, image 37-34, 28, 25), in the posterior aspect of the right temporal lobe (series 5, image 20). There is an additional small punctate focus of acute infarct in the posteroinferior right occipital c lobe, which is likely in the right PCA territory (series 5, image 14). There is a chronic infarct in the high right parietal lobe (series 15, image 42). There is sequela of mild chronic microvascular ischemic change. There is a small amount of petechial hemorrhage associated with infarcts in the low right parietal lobe. No large parenchymal hematoma is visualized. Vascular: Normal flow voids. Skull and upper cervical spine: Normal marrow signal. Sinuses/Orbits: Negative. Other: None IMPRESSION: 1. Multiple acute infarcts in the right MCA territory involving the right frontal lobe, right parietal lobe, and posterior aspect of the right temporal lobe. Given findings on same-day CTA head/neck angiogram, these are favored to be embolic. 2. There is an additional small punctate focus of acute infarct in the right PCA territory involving the right occipital lobe. 3. Small amount of petechial hemorrhage associated with infarcts in the low right parietal lobe. No large parenchymal hematoma is visualized. Electronically Signed   By: Marin Roberts M.D.   On: 11/01/2022 12:54   CT ANGIO HEAD NECK W WO CM  Addendum Date: 11/01/2022   ADDENDUM REPORT: 11/01/2022 09:54 ADDENDUM: #1 was discussed by telephone with Dr. Lucillie Garfinkel on 11/01/2022 at 0937 hours. Electronically Signed   By: Genevie Ann M.D.   On: 11/01/2022 09:54   Result Date:  11/01/2022 CLINICAL DATA:  61 year old female with left side numbness, tingling, paresthesia. EXAM: CT ANGIOGRAPHY HEAD AND NECK TECHNIQUE: Multidetector CT imaging of the head and neck was performed using the standard protocol during bolus administration of intravenous  contrast. Multiplanar CT image reconstructions and MIPs were obtained to evaluate the vascular anatomy. Carotid stenosis measurements (when applicable) are obtained utilizing NASCET criteria, using the distal internal carotid diameter as the denominator. RADIATION DOSE REDUCTION: This exam was performed according to the departmental dose-optimization program which includes automated exposure control, adjustment of the mA and/or kV according to patient size and/or use of iterative reconstruction technique. CONTRAST:  31m OMNIPAQUE IOHEXOL 350 MG/ML SOLN COMPARISON:  Plain head CT today 0837 hours. FINDINGS: CTA NECK Skeleton: Cervical spine degeneration with some bulky endplate changes. No acute osseous abnormality identified. Congenital incomplete ossification of the posterior C1 ring. Upper chest: Asymmetric centrilobular emphysema and scarring in the right upper lobe. Negative visible superior mediastinum. Other neck: No acute finding. Aortic arch: 3 vessel arch configuration. Mild arch tortuosity. Minimal arch atherosclerosis. Right carotid system: Mildly tortuous brachiocephalic artery with some soft plaque but no stenosis. Tortuous proximal right CCA. Minimal right CCA plaque. But at the right ICA origin there is fairly bulky soft and atherosclerotic plaque with evidence of adherent thrombus and thrombus located centrally within the right ICA bulb. See series 4, image 94 and series 10, image 46. Subsequent high-grade stenosis. But the right ICA remains patent to the skull base. Left carotid system: Mildly tortuous left CCA with no significant plaque or stenosis. Soft plaque at the posterior left ICA origin and calcified plaque at the distal  right ICA bulb. Less than 50 % stenosis with respect to the distal vessel. Tortuous left ICA below the skull base. Vertebral arteries: Soft plaque at the right subclavian artery origin without stenosis. Normal right vertebral artery origin. Right vertebral artery appears patent and dominant to the skull base without stenosis. Proximal left subclavian artery soft plaque without stenosis. Soft plaque and moderate or severe stenosis at the left vertebral artery origin on series 8, image 118. The left vertebral is non dominant, diminutive but patent to the skull base. There is subtle asymmetric decreased enhancement of the left vertebral compared to the right. CTA HEAD Posterior circulation: Relatively dominant although normal caliber right vertebral V4 segment is patent to the basilar without stenosis. Patent right PICA origin. Patent but asymmetrically decreased enhancement of the left vertebral V4 segment, which is diminutive beyond the left PICA which remains patent. Small contribution to the vertebrobasilar junction. Patent although diminutive Basilar artery. Patent SCA and left PCA origins. Fetal type right PCA origin. Diminutive or absent left posterior communicating artery. Bilateral PCA branches are patent with no definite stenosis. Anterior circulation: Both ICA siphons are patent with fairly symmetric enhancement. Mild to moderate bilateral siphon calcified plaque with no significant stenosis. Normal right posterior communicating artery origin. Patent carotid termini. Patent MCA and ACA origins. Dominant left and diminutive right ACA A1 segments. Anterior communicating artery is also diminutive. Left ACA A2 somewhat dominant, but bilateral ACA branches appear patent without stenosis. Left MCA M1 segment and trifurcation are patent without stenosis. Left MCA branches are within normal limits. Right MCA M1 segment and bifurcation are patent without stenosis. Right MCA branches appear within normal limits, no  convincing branch occlusion. Venous sinuses: Early contrast timing, not well evaluated. Anatomic variants: Fetal type right PCA origin. Dominant appearing right vertebral artery, although may beyond the basis of left vertebral origin stenosis. Dominant left and diminutive right ACA A1 segments. Review of the MIP images confirms the above findings IMPRESSION: 1. Positive for THROMBUS within the Right ICA origin and bulb, appears adherent to Right ICA origin plaque with subsequent relatively  High-grade stenosis. But the Right ICA and anterior circulation remain patent. No superimposed large vessel occlusion. 2. Positive also for moderate to severe atherosclerotic stenosis at the Left Vertebral Artery origin, with evidence of asymmetrically decreased flow in the left vertebral compared to the right. 3. Proximal right ICA and bilateral ICA siphon atherosclerosis but no other significant arterial stenosis in the head or neck. 4.  Emphysema (ICD10-J43.9). Electronically Signed: By: Genevie Ann M.D. On: 11/01/2022 09:35   CT HEAD WO CONTRAST  Result Date: 11/01/2022 CLINICAL DATA:  LEFT side numbness and tingling, paresthesias, LEFT arm heaviness, symptoms for last few weeks EXAM: CT HEAD WITHOUT CONTRAST TECHNIQUE: Contiguous axial images were obtained from the base of the skull through the vertex without intravenous contrast. RADIATION DOSE REDUCTION: This exam was performed according to the departmental dose-optimization program which includes automated exposure control, adjustment of the mA and/or kV according to patient size and/or use of iterative reconstruction technique. COMPARISON:  04/07/2006 FINDINGS: Brain: Generalized atrophy. Normal ventricular morphology. No midline shift or mass effect. Small old cortical infarct high RIGHT parietal region. Additional likely subacute infarct posterior RIGHT parietal region. Minor small vessel chronic ischemic changes of deep cerebral white matter. No intracranial  hemorrhage, mass lesion, or definite acute infarction. No extra-axial fluid collections. Vascular: No hyperdense vessels. Minimal atherosclerotic calcifications of internal carotid arteries at skull base Skull: Intact Sinuses/Orbits: Clear Other: N/A IMPRESSION: Atrophy with small vessel chronic ischemic changes of deep cerebral white matter. Small old cortical infarct high RIGHT parietal region. Additional likely subacute infarct posterior RIGHT parietal region; this could be better assessed by MR. No acute intracranial abnormalities. Electronically Signed   By: Lavonia Dana M.D.   On: 11/01/2022 08:43      HISTORY OF PRESENT ILLNESS [name] is a 61 y.o. female with PMH significant for Alcohol abuse with 6-8 12Oz beers a night, Depression with anxiety, Dupuytren contracture, HTN, smoker 2-3 packs a day who presents with left sided weakness. About 2-3 weeks ago, left foot and hand went numb and weak but improved within an hour. Has been waxing and waning since then. On 11/01/22, she got up in the morning and was getting her grand kids ready for the school when her L arm and L hand was completely paralyzed and dead and was unable to move it at all. This did not get better so she went to the ED where she was improved again.   HOSPITAL COURSE [name] is a 61 y.o. female with history of alcohol abuse, current 2-3 pk/day smoking history, HTN, depression/GAD, dupuytren contracture presenting with left hand weakness and numbness for 3 weeks and found to have scattered R hemispheric infarcts with minimal petechial hemorrhage and R ICA bifurcation thrombus.   Right MCA scattered infarct embolic secondary to right ICA thrombus with stenosis, etiology unclear  Dupuytren Contracture Acute infarcts likely secondary to embolic shower from ICA bifurcation thrombus.  She was admitted to the neuro ICU and placed on heparin drip and managed conservatively because initial exam was mostly benign except for  mild left hand numbness and weakness with possible confounding from Dupuytren's contracture.  Plan was for repeat imaging and progression to thrombectomy if exam worsened, however she did well and was quickly transitioned out of the ICU. Extremity venous Doppler study was obtained revealed no DVT.  TEE was recommended, however patient declined and mentioned she would prefer to be discharged.  Hypercoagulable workup was obtained and is currently pending.  HLD management  as below. A1c is 5.0.  UDS positive for THC and patient does endorse current tobacco and alcohol use.  Counseled cessation and managed with nicotine replacement as below.  Heparin was continued from the time of admission to 11/05/2022 and then she was transitioned to Eliquis.  Exam remained stable for the duration of her admission and after PT/OT evaluation, only identified need was for an outpatient OT, mostly due to contracture.  Patient will be discharged on eliquis continue for at least 1 month.  She was following arranged in 1 month and will need repeat CTA and can continue Eliquis for another month if she still has thrombus, if not can transition to antiplatelet therapy.  HTN Mildly hypertensive on presenting for admission.  Started amlodipine 10 daily with good response.  HLD LDL 137, goal<70.  Started Lipitor 80, will continue at discharge.  Polysubstance use CIWA scores while inpatient were unremarkable.  Did occasionally require small doses of Ativan.  Tobacco use disorder managed with nicotine replacement.  Was THC positive on initial UDS.  Counseled on polysubstance use cessation and will follow-up with PCP.  Consider Chantix for smoking cessation and naltrexone for alcohol cessation in the outpatient setting.  RN Pressure Injury Documentation:     DISCHARGE EXAM Blood pressure (!) 150/96, pulse 76, temperature 98.6 F (37 C), temperature source Oral, resp. rate 16, height _0  (1.626 m), weight 59.2 kg, SpO2 100  %. General: NAD Neuro: A&O x 4.  Good attention and is interactive on exam. Cranial nerves grossly intact. 5/5 strength in all extremities with exception of 4+/5 strength in the left hand/proximal LUE. Also has numbness in L hand. Psych: normal mood and affect  Discharge Diet       Diet   Diet Heart Room service appropriate? Yes; Fluid consistency: Thin   liquids  DISCHARGE PLAN Disposition:  home, outpatient OT Eliquis (apixaban) daily for secondary stroke prevention for at least 1 month, pending repeat CTA Ongoing stroke risk factor control by Primary Care Physician at time of discharge including continuing amlodipine 10 for HTN, lipitor 80 for hld, polysubstance use cessation Follow-up PCP in 1-2 weeks Follow-up in [facility] in 4 weeks, office to schedule an appointment.

## 2022-11-07 NOTE — TOC CAGE-AID Note (Signed)
Transition of Care [facility]) - CAGE-AID Screening   Patient Details  Name: [name] MRN: [id number] Date of Birth: [birth year]  Transition of Care [facility]) CM/SW Contact:    Kermit Balo, RN Phone Number: 11/07/2022, 10:38 AM   Clinical Narrative: Pt refused inpatient/ outpatient alcohol counseling.   CAGE-AID Screening:    Have You Ever Felt You Ought to Cut Down on Your Drinking or Drug Use?: Yes Have People Annoyed You By Critizing Your Drinking Or Drug Use?: Yes Have You Felt Bad Or Guilty About Your Drinking Or Drug Use?: No Have You Ever Had a Drink or Used Drugs First Thing In The Morning to Steady Your Nerves or to Get Rid of a Hangover?: No CAGE-AID Score: 2  Substance Abuse Education Offered: Yes (refused)

## 2022-11-07 NOTE — TOC Transition Note (Addendum)
Transition of Care [facility]) - CM/SW Discharge Note   Patient Details  Name: [name] MRN: [id number] Date of Birth: Nov 21, 1961  Transition of Care [facility]) CM/SW Contact:  Kermit Balo, RN Phone Number: 11/07/2022, 10:39 AM   Clinical Narrative:    Pt is from home with her mother and grandchild. She states someone is with her most of the time. She has access to DME but doesn't currently use any.  Pt states she hasn't seen a MD in 12 years. No health insurance. CM has placed the [facility] on AVS for her to follow up with. They will also assist with the cost of medications.  Pt drives self as needed.  Outpatient arranged through [facility]. Information on the AVS.  TOC following for medications assistance when discharged. Pt states her daughter will provide transport home.   1400: MATCH provided for d/c meds. Medications to be delivered to the room per [facility] pharmacy.  Final next level of care: OP Rehab Barriers to Discharge: Inadequate or no insurance, Barriers Unresolved (comment)   Patient Goals and CMS Choice     Choice offered to / list presented to : Patient  Discharge Placement                       Discharge Plan and Services                                     Social Determinants of Health (SDOH) Interventions     Readmission Risk Interventions     No data to display

## 2022-11-07 NOTE — Progress Notes (Signed)
Discharge instructions provided to patient. All medications, follow up appointments, and discharge instructions discussed. IV out. Monitor off. CCMD notified. Discharging to home.  Zaire Levesque R Lamiyah Schlotter, RN  

## 2022-11-08 ENCOUNTER — Other Ambulatory Visit: Payer: Self-pay | Admitting: Neurology

## 2022-11-08 DIAGNOSIS — I63131 Cerebral infarction due to embolism of right carotid artery: Secondary | ICD-10-CM

## 2022-11-08 LAB — CARDIOLIPIN ANTIBODIES, IGG, IGM, IGA
Anticardiolipin IgA: <9 APL U/mL (ref 0–11)
Anticardiolipin IgG: <9 GPL U/mL (ref 0–14)
Anticardiolipin IgM: 11 MPL U/mL (ref 0–12)

## 2022-11-08 LAB — LUPUS ANTICOAGULANT PANEL
DRVVT: 31.6 s (ref 0.0–47.0)
PTT Lupus Anticoagulant: 34.1 s (ref 0.0–43.5)

## 2022-11-08 SURGERY — ECHOCARDIOGRAM, TRANSESOPHAGEAL
Anesthesia: Monitor Anesthesia Care

## 2022-11-09 LAB — BETA-2-GLYCOPROTEIN I ABS, IGG/M/A
Beta-2 Glyco I IgG: <9 GPI IgG units (ref 0–20)
Beta-2-Glycoprotein I IgA: <9 GPI IgA units (ref 0–25)
Beta-2-Glycoprotein I IgM: 9 GPI IgM units (ref 0–32)

## 2022-11-22 ENCOUNTER — Inpatient Hospital Stay: Payer: Self-pay | Admitting: Neurology

## 2023-08-03 ENCOUNTER — Other Ambulatory Visit (HOSPITAL_COMMUNITY): Payer: Self-pay

## 2023-08-21 ENCOUNTER — Other Ambulatory Visit (HOSPITAL_COMMUNITY): Payer: Self-pay

## 2023-08-22 ENCOUNTER — Other Ambulatory Visit (HOSPITAL_COMMUNITY): Payer: Self-pay

## 2023-08-23 ENCOUNTER — Other Ambulatory Visit (HOSPITAL_COMMUNITY): Payer: Self-pay

## 2023-12-26 ENCOUNTER — Other Ambulatory Visit: Payer: Self-pay

## 2024-01-02 ENCOUNTER — Other Ambulatory Visit: Payer: Self-pay

## 2024-03-26 ENCOUNTER — Other Ambulatory Visit: Payer: Self-pay

## 2024-04-19 ENCOUNTER — Other Ambulatory Visit: Payer: Self-pay

## 2024-06-10 ENCOUNTER — Other Ambulatory Visit: Payer: Self-pay

## 2024-06-18 ENCOUNTER — Other Ambulatory Visit: Payer: Self-pay

## 2024-07-18 ENCOUNTER — Other Ambulatory Visit: Payer: Self-pay

## 2024-07-22 ENCOUNTER — Other Ambulatory Visit: Payer: Self-pay

## 2024-08-16 ENCOUNTER — Other Ambulatory Visit: Payer: Self-pay

## 2024-08-20 ENCOUNTER — Other Ambulatory Visit: Payer: Self-pay

## 2024-08-21 ENCOUNTER — Other Ambulatory Visit: Payer: Self-pay

## 2024-10-24 ENCOUNTER — Other Ambulatory Visit: Payer: Self-pay
